# Patient Record
Sex: Male | Born: 1990 | Race: White | Hispanic: No | Marital: Single | Smoking: Never smoker
Health system: Southern US, Community
[De-identification: ages and names within clinical notes are randomized; demographics above are authoritative.]

## PROBLEM LIST (undated history)

## (undated) DIAGNOSIS — T7840XA Allergy, unspecified, initial encounter: Secondary | ICD-10-CM

## (undated) HISTORY — PX: EYE SURGERY: SHX253

## (undated) HISTORY — DX: Allergy, unspecified, initial encounter: T78.40XA

---

## 2012-06-25 ENCOUNTER — Ambulatory Visit (INDEPENDENT_AMBULATORY_CARE_PROVIDER_SITE_OTHER): Payer: BC Managed Care – PPO | Admitting: Psychiatry

## 2015-06-03 ENCOUNTER — Ambulatory Visit (INDEPENDENT_AMBULATORY_CARE_PROVIDER_SITE_OTHER): Payer: BC Managed Care – PPO | Admitting: Family Medicine

## 2015-06-03 ENCOUNTER — Encounter: Payer: Self-pay | Admitting: Family Medicine

## 2015-06-03 VITALS — BP 130/85 | HR 84 | Temp 97.2°F | Resp 12 | Ht 70.28 in | Wt 235.7 lb

## 2015-06-03 DIAGNOSIS — Z6833 Body mass index (BMI) 33.0-33.9, adult: Secondary | ICD-10-CM

## 2015-06-03 DIAGNOSIS — E669 Obesity, unspecified: Secondary | ICD-10-CM | POA: Insufficient documentation

## 2015-06-03 DIAGNOSIS — R030 Elevated blood-pressure reading, without diagnosis of hypertension: Secondary | ICD-10-CM | POA: Diagnosis not present

## 2015-06-03 DIAGNOSIS — Z6834 Body mass index (BMI) 34.0-34.9, adult: Secondary | ICD-10-CM | POA: Insufficient documentation

## 2015-06-03 LAB — TSH: TSH: 1.81 u[IU]/mL (ref 0.35–4.50)

## 2015-06-03 NOTE — Patient Instructions (Addendum)
A few things to remember from today's visit:   1. BMI 33.0-33.9,adult  - TSH - BASIC METABOLIC PANEL WITH GFR  2. Elevated blood pressure (not hypertension) Re-checked and within normal limits.  - TSH - BASIC METABOLIC PANEL WITH GFR    Monitor blood pressure periodically. Healthy diet and regular exercise help with prevention. Wt loss al helps.  Risk of elevated blood pressure increases with age. If labs normal I think you can follow annually, before if blood pressure > 140/90 and/or other problem presents.   If you sign-up for My chart, you can communicate easier with us in case you have any question or concern.
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
## 2015-06-04 LAB — BASIC METABOLIC PANEL WITH GFR
BUN: 16 mg/dL (ref 7–25)
CALCIUM: 9.6 mg/dL (ref 8.6–10.3)
CHLORIDE: 103 mmol/L (ref 98–110)
CO2: 25 mmol/L (ref 20–31)
Creat: 0.8 mg/dL (ref 0.60–1.35)
GLUCOSE: 88 mg/dL (ref 65–99)
POTASSIUM: 4.7 mmol/L (ref 3.5–5.3)
SODIUM: 140 mmol/L (ref 135–146)

## 2015-08-07 ENCOUNTER — Encounter: Payer: Self-pay | Admitting: Family Medicine

## 2015-08-07 ENCOUNTER — Ambulatory Visit (INDEPENDENT_AMBULATORY_CARE_PROVIDER_SITE_OTHER): Payer: BC Managed Care – PPO | Admitting: Family Medicine

## 2015-08-07 VITALS — BP 130/85 | HR 82 | Temp 98.2°F | Resp 12 | Ht 70.28 in | Wt 236.0 lb

## 2015-08-07 DIAGNOSIS — Z114 Encounter for screening for human immunodeficiency virus [HIV]: Secondary | ICD-10-CM | POA: Diagnosis not present

## 2015-08-07 DIAGNOSIS — Z711 Person with feared health complaint in whom no diagnosis is made: Secondary | ICD-10-CM | POA: Diagnosis not present

## 2015-08-07 DIAGNOSIS — R300 Dysuria: Secondary | ICD-10-CM | POA: Diagnosis not present

## 2015-08-07 LAB — POCT URINALYSIS DIPSTICK
BILIRUBIN UA: NEGATIVE
Glucose, UA: NEGATIVE
KETONES UA: NEGATIVE
LEUKOCYTES UA: NEGATIVE
Nitrite, UA: NEGATIVE
PH UA: 6
PROTEIN UA: NEGATIVE
RBC UA: NEGATIVE
Urobilinogen, UA: 0.2

## 2015-08-07 MED ORDER — DOXYCYCLINE HYCLATE 100 MG PO TABS: 100.0000 mg | ORAL_TABLET | Freq: Two times a day (BID) | ORAL | Status: AC

## 2015-08-07 NOTE — Progress Notes (Signed)
Pre visit review using our clinic review tool, if applicable. No additional management support is needed unless otherwise documented below in the visit note. 
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
## 2015-08-07 NOTE — Patient Instructions (Addendum)
A few things to remember from today's visit:    Screening for HIV (human immunodeficiency virus)  - HIV antibody (with reflex)  3. Concern about STD in male without diagnosis  - HIV antibody (with reflex) - doxycycline (VIBRA-TABS) 100 MG tablet; Take 1 tablet (100 mg total) by mouth 2 (two) times daily. With food  Dispense: 14 tablet; Refill: 0  4. Dysuria  - doxycycline (VIBRA-TABS) 100 MG tablet; Take 1 tablet (100 mg total) by mouth 2 (two) times daily. With food  Dispense: 14 tablet; Refill: 0    Today we will start antibiotics. Further recommendations would be given according to the lab results.  Condom use strongly recommended.  If you sign-up for My chart, you can communicate easier with us in case you have any question or concern.

## 2015-08-08 LAB — HIV ANTIBODY (ROUTINE TESTING W REFLEX): HIV: NONREACTIVE

## 2015-08-13 ENCOUNTER — Telehealth: Payer: Self-pay | Admitting: Family Medicine

## 2015-08-13 NOTE — Telephone Encounter (Signed)
Pt called back about results. Gave patient the results again and pt verbalized understanding. Pt said that he did not want to re test in 3 months.

## 2015-08-13 NOTE — Telephone Encounter (Signed)
Called and left a voicemail for patient. Calling in reference to his recent lab work. Chlamydia came back positive, but pt is already being treated with doxycycline. Pt can be re screened in about 3 months if he would like.

## 2016-06-24 ENCOUNTER — Ambulatory Visit (INDEPENDENT_AMBULATORY_CARE_PROVIDER_SITE_OTHER): Payer: BC Managed Care – PPO | Admitting: Family Medicine

## 2016-06-24 ENCOUNTER — Encounter: Payer: Self-pay | Admitting: Family Medicine

## 2016-06-24 VITALS — BP 130/72 | HR 84 | Temp 98.4°F | Resp 12 | Ht 70.28 in | Wt 210.5 lb

## 2016-06-24 DIAGNOSIS — R059 Cough, unspecified: Secondary | ICD-10-CM

## 2016-06-24 DIAGNOSIS — J029 Acute pharyngitis, unspecified: Secondary | ICD-10-CM | POA: Diagnosis not present

## 2016-06-24 DIAGNOSIS — R05 Cough: Secondary | ICD-10-CM | POA: Diagnosis not present

## 2016-06-24 LAB — POCT RAPID STREP A (OFFICE): Rapid Strep A Screen: NEGATIVE

## 2016-06-24 MED ORDER — HYDROCODONE-HOMATROPINE 5-1.5 MG/5ML PO SYRP: 5.0000 mL | ORAL_SOLUTION | Freq: Every evening | ORAL | 0 refills | Status: AC | PRN

## 2016-06-24 NOTE — Patient Instructions (Signed)
A few things to remember from today's visit:   Sore throat - Plan: POC Rapid Strep A  Acute pharyngitis, unspecified etiology - Plan: Culture, Group A Strep  viral infections are self-limited and we treat each symptom depending of severity.  Over the counter medications as decongestants and cold medications usually help, they need to be taken with caution if there is a history of high blood pressure or palpitations. Tylenol and/or Ibuprofen also helps with most symptoms (headache, muscle aching, fever,etc) Plenty of fluids. Honey helps with cough. Steam inhalations helps with runny nose, nasal congestion, and may prevent sinus infections. Cough and nasal congestion could last a few days and sometimes weeks. Please follow in not any better in 1-2 weeks or if symptoms get worse.    Symptomatic treatment: Over the counter Acetaminophen 500 mg and/or Ibuprofen (400-600 mg) if there is not contraindications; you can alternate in between both every 4-6 hours. Gargles with saline water and throat lozenges might also help. Cold fluids.    Seek prompt medical evaluation if you are having difficulty breathing, mouth swelling, throat closing up, not able to swallow liquids (drooling), skin rash/bruising, or worsening symptoms.  Please follow up in 2 weeks if not any better.    Please be sure medication list is accurate. If a new problem present, please set up appointment sooner than planned today.

## 2016-06-24 NOTE — Progress Notes (Signed)
HPI:  ACUTE VISIT  Chief Complaint  Patient presents with  . Sore Throat  . Cough    [name] is a 25 y.o.male here today complaining of 5-7 days of respiratory symptoms.  Productive cough mainly in the morning. Sore throat that seems better today. Fatigue has resolved.   Sore Throat   This is a new problem. The current episode started in the past 7 days. The problem has been gradually improving. Neither side of throat is experiencing more pain than the other. There has been no fever. The pain is moderate. Associated symptoms include congestion and coughing. Pertinent negatives include no abdominal pain, diarrhea, headaches, neck pain, shortness of breath, trouble swallowing or vomiting. He has had no exposure to strep or mono. The treatment provided mild relief.    + Nasal congestion, rhinorrhea, and post nasal drainage. Denies current fever,chills, or body aches.He thinks he might have some fever when symptoms first started. He has not noted chest pain, dyspnea, or wheezing. Bloody rhinorrhea until 2 days ago.  No Hx of recent travel. No sick contact.His 66 year old son goes to day care, he has had some runny nose but he thinks it is allergies. No known insect bite.  Hx of allergies: Yes  OTC medications for this problem: Nyquil,Dyquil  Symptoms otherwise better today.   Review of Systems  Constitutional: Negative for appetite change, chills, fatigue and fever.  HENT: Positive for congestion, nosebleeds, rhinorrhea and sore throat. Negative for mouth sores, postnasal drip, sneezing, trouble swallowing and voice change.   Eyes: Negative for redness and itching.  Respiratory: Positive for cough. Negative for shortness of breath and wheezing.   Cardiovascular: Negative.   Gastrointestinal: Negative for abdominal pain, diarrhea, nausea and vomiting.  Musculoskeletal: Negative for back pain, joint swelling and neck pain.  Skin: Negative for rash.    Allergic/Immunologic: Positive for environmental allergies.  Neurological: Negative for syncope, weakness and headaches.  Hematological: Negative for adenopathy. Does not bruise/bleed easily.    No current outpatient prescriptions on file prior to visit.   No current facility-administered medications on file prior to visit.     Past Medical History:  Diagnosis Date  . Allergy    Not on File  Social History   Social History  . Marital status: Married    Spouse name: N/A  . Number of children: N/A  . Years of education: N/A   Social History Main Topics  . Smoking status: Never Smoker  . Smokeless tobacco: Never Used  . Alcohol use No  . Drug use: No  . Sexual activity: Not Asked   Other Topics Concern  . None   Social History Narrative  . None    Vitals:   06/24/16 1052  BP: 130/72  Pulse: 84  Resp: 12  Temp: 98.4 F (36.9 C)  O2 sat at RA 98% Body mass index is 29.96 kg/m.   Physical Exam  Nursing note and vitals reviewed. Constitutional: He is oriented to person, place, and time. He appears well-developed. He does not appear ill. No distress.  HENT:  Head: Atraumatic.  Right Ear: Tympanic membrane, external ear and ear canal normal.  Left Ear: Tympanic membrane, external ear and ear canal normal.  Nose: Rhinorrhea present. Right sinus exhibits no maxillary sinus tenderness and no frontal sinus tenderness. Left sinus exhibits no maxillary sinus tenderness and no frontal sinus tenderness.  Mouth/Throat: Uvula is midline and mucous membranes are normal. Posterior oropharyngeal erythema present.  No oropharyngeal exudate or posterior oropharyngeal edema.  Post nasal drainage.  Eyes: Conjunctivae and EOM are normal.  Cardiovascular: Normal rate and regular rhythm.   No murmur heard. Respiratory: Effort normal and breath sounds normal. No stridor. No respiratory distress.  Lymphadenopathy:       Head (right side): No submandibular adenopathy present.        Head (left side): No submandibular adenopathy present.    He has cervical adenopathy.       Right cervical: Posterior cervical adenopathy present.       Left cervical: Posterior cervical adenopathy present.  Neurological: He is alert and oriented to person, place, and time. He has normal strength.  Skin: Skin is warm. No rash noted. No erythema.  Psychiatric: He has a normal mood and affect. His speech is normal.  Well groomed, good eye contact.      ASSESSMENT AND PLAN:   Ronald Ridgelaylor was seen today for sore throat and cough.  Diagnoses and all orders for this visit:  Sore throat -     POC Rapid Strep A  Acute pharyngitis, unspecified etiology -     Culture, Group A Strep  Cough -     HYDROcodone-homatropine (HYCODAN) 5-1.5 MG/5ML syrup; Take 5 mLs by mouth at bedtime as needed for cough.   Symptoms suggests a viral etiology, symptomatic treatment recommended in this case, so I do not think abx is needed at this time. Because he has a 26 yo at home, strep Cx was ordered. Instructed to monitor for signs of complications, including new onset of fever among some, clearly instructed about warning signs. I also explained that cough and nasal congestion can last a few days and sometimes weeks. Some side effects of Hydrocodone discussed, recommend taking at night for a few days.  F/U as needed.     -[name] was advised to follow if symptoms worsen or persist or new concerns arise.       Apolonia Ellwood G. [place], MD  [facility]. [place] office.

## 2016-06-26 LAB — CULTURE, GROUP A STREP

## 2018-02-14 ENCOUNTER — Emergency Department (HOSPITAL_COMMUNITY): Payer: Worker's Compensation

## 2018-02-14 ENCOUNTER — Emergency Department (HOSPITAL_COMMUNITY)
Admission: EM | Admit: 2018-02-14 | Discharge: 2018-02-14 | Disposition: A | Discharge status: Home / Self Care | Payer: Worker's Compensation | Attending: Emergency Medicine | Admitting: Emergency Medicine

## 2018-02-14 ENCOUNTER — Encounter (HOSPITAL_COMMUNITY): Payer: Self-pay | Admitting: Emergency Medicine

## 2018-02-14 ENCOUNTER — Other Ambulatory Visit: Payer: Self-pay

## 2018-02-14 DIAGNOSIS — S0990XA Unspecified injury of head, initial encounter: Secondary | ICD-10-CM

## 2018-02-14 DIAGNOSIS — Y990 Civilian activity done for income or pay: Secondary | ICD-10-CM | POA: Insufficient documentation

## 2018-02-14 DIAGNOSIS — Y9389 Activity, other specified: Secondary | ICD-10-CM | POA: Diagnosis not present

## 2018-02-14 DIAGNOSIS — Y92149 Unspecified place in prison as the place of occurrence of the external cause: Secondary | ICD-10-CM | POA: Insufficient documentation

## 2018-02-14 DIAGNOSIS — S0083XA Contusion of other part of head, initial encounter: Secondary | ICD-10-CM | POA: Diagnosis not present
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
## 2018-02-14 NOTE — Discharge Instructions (Addendum)
There was significant swelling and redness noted to the L side of your face today. You will need to put ice in the area to help with swelling, take Tylenol as needed for pain. There was no fracture noted on CT of your face. Read the following instructions regarding head injury precautions. Return to ED for worsening symptoms, severe headache, additional injuries, trouble moving your eyes or blurry vision.

## 2021-01-05 ENCOUNTER — Telehealth: Payer: Self-pay | Admitting: Nurse Practitioner

## 2021-01-05 NOTE — Telephone Encounter (Signed)
Patient called to see if he could schedule his physical. I let patient know that it had been about four years since he had been seen by Dr.Jordan which puts him over the threshold of being a new patient and our providers are currently not accepting new patients. Patient wanted message sent back to ask and declined the other offices numbers that may still be accepting new patients.     Good callback number is [phone]        Please advise

## 2021-01-18 NOTE — Telephone Encounter (Signed)
It is ok to re-establish care and to schedule CPE. Thanks, BJ

## 2021-01-20 NOTE — Telephone Encounter (Signed)
I tried to call patient but there was no answer. I left a message for him to call back to schedule

## 2021-02-16 NOTE — Progress Notes (Signed)
HPI: [name] is a 31 y.o.male here today for his routine physical examination.  Last CPE: 07/2017  Regular exercise 3 or more times per week: Wt lifting, obstacle training,boxing, and kick boxing. Following a healthy diet: He has not been consistent, trying to start high protein diet.  Chronic medical problems: Allergies and BMI 34.  Immunization History  Administered Date(s) Administered   PPD Test 06/20/2018   Tdap 01/24/2011   Health Maintenance  Topic Date Due   Hepatitis C Screening  Never done   COVID-19 Vaccine (1) 03/05/2021 (Originally 03/20/1991)   INFLUENZA VACCINE  05/07/2021 (Originally 09/07/2020)   TETANUS/TDAP  02/17/2022 (Originally 01/23/2021)   HIV Screening  Completed   Pneumococcal Vaccine 20-33 Years old  Aged Out   HPV VACCINES  Aged Out   -Negative for high alcohol intake or tobacco use. Sleeps about 8 hours.  -Concerns and/or follow up today:  His mother just had new hx of CAD, MI 2 months ago at age 32. Father died from MI at age 49.  He goes to chiropractor q 2 weeks and gets a massage monthly for prevention. He has some pain when he gets massage on lower back, according to pt, he was told it could be his kidneys.  Negative for gross hematuria, foam in urine,or decreased urine output. He would like renal function check.  Review of Systems  Constitutional:  Negative for activity change, appetite change, fatigue and fever.  HENT:  Negative for mouth sores, nosebleeds, sore throat, trouble swallowing and voice change.   Eyes:  Negative for redness and visual disturbance.  Respiratory:  Negative for cough, shortness of breath and wheezing.   Cardiovascular:  Negative for chest pain, palpitations and leg swelling.  Gastrointestinal:  Negative for abdominal pain, blood in stool, nausea and vomiting.  Endocrine: Negative for cold intolerance, heat intolerance, polydipsia, polyphagia and polyuria.  Genitourinary:  Negative for  decreased urine volume, dysuria, genital sores and testicular pain.  Musculoskeletal:  Negative for gait problem and myalgias.  Skin:  Negative for color change and rash.  Allergic/Immunologic: Positive for environmental allergies.  Neurological:  Negative for syncope, weakness and headaches.  Hematological:  Negative for adenopathy. Does not bruise/bleed easily.  Psychiatric/Behavioral:  Negative for confusion and sleep disturbance.   All other systems reviewed and are negative.  No current outpatient medications on file prior to visit.   No current facility-administered medications on file prior to visit.   Past Medical History:  Diagnosis Date   Allergy    History reviewed. No pertinent surgical history.  No Known Allergies  Family History  Problem Relation Age of Onset   Heart disease Mother 21       CAD   Hypertension Father    Heart failure Father    Hypertension Maternal Grandfather    Social History   Socioeconomic History   Marital status: Single    Spouse name: Not on file   Number of children: Not on file   Years of education: Not on file   Highest education level: Not on file  Occupational History   Not on file  Tobacco Use   Smoking status: Never   Smokeless tobacco: Never  Substance and Sexual Activity   Alcohol use: No    Alcohol/week: 0.0 standard drinks   Drug use: No   Sexual activity: Not on file  Other Topics Concern   Not on file  Social History Narrative   Not on file   Social Determinants  of Health   Financial Resource Strain: Not on file  Food Insecurity: Not on file  Transportation Needs: Not on file  Physical Activity: Not on file  Stress: Not on file  Social Connections: Not on file   Vitals:   02/17/21 0743  BP: 130/80  Pulse: 95  Resp: 16  SpO2: 99%   Body mass index is 34.2 kg/m.  Wt Readings from Last 3 Encounters:  02/17/21 238 lb 6 oz (108.1 kg)  02/14/18 210 lb (95.3 kg)  06/24/16 210 lb 8 oz (95.5 kg)    Physical Exam Vitals and nursing note reviewed.  Constitutional:      General: He is not in acute distress.    Appearance: He is well-developed.  HENT:     Head: Normocephalic and atraumatic.     Right Ear: External ear normal. Tympanic membrane is not erythematous.     Left Ear: Tympanic membrane, ear canal and external ear normal.     Ears:     Comments: Cerumen excess in right ear canal, could not see TM.    Mouth/Throat:     Mouth: Mucous membranes are moist.     Pharynx: Oropharynx is clear. Uvula midline.     Tonsils: 1+ on the right. 1+ on the left.  Eyes:     Extraocular Movements: Extraocular movements intact.     Conjunctiva/sclera: Conjunctivae normal.     Pupils: Pupils are equal, round, and reactive to light.  Neck:     Thyroid: No thyromegaly.     Trachea: No tracheal deviation.  Cardiovascular:     Rate and Rhythm: Normal rate and regular rhythm.     Pulses:          Dorsalis pedis pulses are 2+ on the right side and 2+ on the left side.     Heart sounds: No murmur heard. Pulmonary:     Effort: Pulmonary effort is normal. No respiratory distress.     Breath sounds: Normal breath sounds.  Abdominal:     Palpations: Abdomen is soft. There is no hepatomegaly or mass.     Tenderness: There is no abdominal tenderness.  Genitourinary:    Comments: No concerns. Musculoskeletal:        General: No tenderness.     Cervical back: Normal range of motion.     Comments: No major deformities appreciated and no signs of synovitis.  Lymphadenopathy:     Cervical: No cervical adenopathy.     Upper Body:     Right upper body: No supraclavicular adenopathy.     Left upper body: No supraclavicular adenopathy.  Skin:    General: Skin is warm.     Findings: No erythema.  Neurological:     General: No focal deficit present.     Mental Status: He is alert and oriented to person, place, and time.     Cranial Nerves: No cranial nerve deficit.     Sensory: No sensory  deficit.     Gait: Gait normal.     Deep Tendon Reflexes:     Reflex Scores:      Bicep reflexes are 2+ on the right side and 2+ on the left side.      Patellar reflexes are 2+ on the right side and 2+ on the left side. Psychiatric:        Mood and Affect: Mood and affect normal.   ASSESSMENT AND PLAN:  [name] was seen today for annual exam.  Diagnoses and all orders  for this visit: Orders Placed This Encounter  Procedures   Hepatitis C antibody   Basic metabolic panel   Lipid panel   HIV Antibody (routine testing w rflx)   RPR   Lab Results  Component Value Date   CHOL 161 02/17/2021   HDL 51.10 02/17/2021   LDLCALC 79 02/17/2021   TRIG 154.0 (H) 02/17/2021   CHOLHDL 3 02/17/2021   Lab Results  Component Value Date   CREATININE 1.11 02/17/2021   BUN 20 02/17/2021   NA 137 02/17/2021   K 4.2 02/17/2021   CL 100 02/17/2021   CO2 30 02/17/2021   Routine general medical examination at a health care facility We discussed the importance of regular physical activity and healthy diet for prevention of chronic illness and/or complications. Preventive guidelines reviewed. Vaccination up to date. Next CPE in a year.  Encounter for HCV screening test for low risk patient -     Hepatitis C antibody  Screening for STD (sexually transmitted disease) -     RPR -     HIV Antibody (routine testing w rflx) -     Urine cytology ancillary only  Screening for lipoid disorders -     Lipid panel  Screening for endocrine, metabolic and immunity disorder -     Basic metabolic panel  Return in 1 year (on 02/17/2022) for cpe.  Mackenzie Lia G. Martinique, MD  [facility]. [place] office.

## 2021-02-17 ENCOUNTER — Other Ambulatory Visit (HOSPITAL_COMMUNITY)
Admission: RE | Admit: 2021-02-17 | Discharge: 2021-02-17 | Disposition: A | Discharge status: Home / Self Care | Payer: 59 | Source: Ambulatory Visit | Attending: Family Medicine | Admitting: Family Medicine

## 2021-02-17 ENCOUNTER — Encounter: Payer: Self-pay | Admitting: Family Medicine

## 2021-02-17 ENCOUNTER — Ambulatory Visit (INDEPENDENT_AMBULATORY_CARE_PROVIDER_SITE_OTHER): Payer: 59 | Admitting: Family Medicine

## 2021-02-17 VITALS — BP 130/80 | HR 95 | Resp 16 | Ht 70.0 in | Wt 238.4 lb

## 2021-02-17 DIAGNOSIS — Z13228 Encounter for screening for other metabolic disorders: Secondary | ICD-10-CM

## 2021-02-17 DIAGNOSIS — Z13220 Encounter for screening for lipoid disorders: Secondary | ICD-10-CM

## 2021-02-17 DIAGNOSIS — Z1329 Encounter for screening for other suspected endocrine disorder: Secondary | ICD-10-CM | POA: Diagnosis not present

## 2021-02-17 DIAGNOSIS — Z113 Encounter for screening for infections with a predominantly sexual mode of transmission: Secondary | ICD-10-CM | POA: Diagnosis not present

## 2021-02-17 DIAGNOSIS — Z1159 Encounter for screening for other viral diseases: Secondary | ICD-10-CM

## 2021-02-17 DIAGNOSIS — Z0000 Encounter for general adult medical examination without abnormal findings: Secondary | ICD-10-CM

## 2021-02-17 DIAGNOSIS — Z130 Encounter for screening for diseases of the blood and blood-forming organs and certain disorders involving the immune mechanism: Secondary | ICD-10-CM | POA: Diagnosis not present

## 2021-02-17 LAB — LIPID PANEL
Cholesterol: 161 mg/dL (ref 0–200)
HDL: 51.1 mg/dL (ref >39.00)
LDL Cholesterol: 79 mg/dL (ref 0–99)
NonHDL: 109.42
Total CHOL/HDL Ratio: 3
Triglycerides: 154 mg/dL — ABNORMAL HIGH (ref 0.0–149.0)
VLDL: 30.8 mg/dL (ref 0.0–40.0)

## 2021-02-17 LAB — BASIC METABOLIC PANEL
BUN: 20 mg/dL (ref 6–23)
CO2: 30 mEq/L (ref 19–32)
Calcium: 9.7 mg/dL (ref 8.4–10.5)
Chloride: 100 mEq/L (ref 96–112)
Creatinine, Ser: 1.11 mg/dL (ref 0.40–1.50)
GFR: 89.16 mL/min (ref >60.00)
Glucose, Bld: 89 mg/dL (ref 70–99)
Potassium: 4.2 mEq/L (ref 3.5–5.1)
Sodium: 137 mEq/L (ref 135–145)

## 2021-02-17 NOTE — Patient Instructions (Addendum)
A few things to remember from today's visit:  Routine general medical examination at a health care facility  Encounter for HCV screening test for low risk patient - Plan: Hepatitis C antibody  Screening for STD (sexually transmitted disease) - Plan: Urine cytology ancillary only, HIV Antibody (routine testing w rflx), RPR  Screening for lipoid disorders - Plan: Lipid panel  Screening for endocrine, metabolic and immunity disorder - Plan: Basic metabolic panel  Do not use My Chart to request refills or for acute issues that need immediate attention.   Please be sure medication list is accurate. If a new problem present, please set up appointment sooner than planned today.  Health Maintenance, Male Adopting a healthy lifestyle and getting preventive care are important in promoting health and wellness. Ask your health care provider about: The right schedule for you to have regular tests and exams. Things you can do on your own to prevent diseases and keep yourself healthy. What should I know about diet, weight, and exercise? Eat a healthy diet  Eat a diet that includes plenty of vegetables, fruits, low-fat dairy products, and lean protein. Do not eat a lot of foods that are high in solid fats, added sugars, or sodium. Maintain a healthy weight Body mass index (BMI) is a measurement that can be used to identify possible weight problems. It estimates body fat based on height and weight. Your health care provider can help determine your BMI and help you achieve or maintain a healthy weight. Get regular exercise Get regular exercise. This is one of the most important things you can do for your health. Most adults should: Exercise for at least 150 minutes each week. The exercise should increase your heart rate and make you sweat (moderate-intensity exercise). Do strengthening exercises at least twice a week. This is in addition to the moderate-intensity exercise. Spend less time sitting. Even  light physical activity can be beneficial. Watch cholesterol and blood lipids Have your blood tested for lipids and cholesterol at 31 years of age, then have this test every 5 years. You may need to have your cholesterol levels checked more often if: Your lipid or cholesterol levels are high. You are older than 31 years of age. You are at high risk for heart disease. What should I know about cancer screening? Many types of cancers can be detected early and may often be prevented. Depending on your health history and family history, you may need to have cancer screening at various ages. This may include screening for: Colorectal cancer. Prostate cancer. Skin cancer. Lung cancer. What should I know about heart disease, diabetes, and high blood pressure? Blood pressure and heart disease High blood pressure causes heart disease and increases the risk of stroke. This is more likely to develop in people who have high blood pressure readings or are overweight. Talk with your health care provider about your target blood pressure readings. Have your blood pressure checked: Every 3-5 years if you are 56-42 years of age. Every year if you are 51 years old or older. If you are between the ages of 1 and 72 and are a current or former smoker, ask your health care provider if you should have a one-time screening for abdominal aortic aneurysm (AAA). Diabetes Have regular diabetes screenings. This checks your fasting blood sugar level. Have the screening done: Once every three years after age 42 if you are at a normal weight and have a low risk for diabetes. More often and at a younger  age if you are overweight or have a high risk for diabetes. What should I know about preventing infection? Hepatitis B If you have a higher risk for hepatitis B, you should be screened for this virus. Talk with your health care provider to find out if you are at risk for hepatitis B infection. Hepatitis C Blood testing is  recommended for: Everyone born from 1 through 1965. Anyone with known risk factors for hepatitis C. Sexually transmitted infections (STIs) You should be screened each year for STIs, including gonorrhea and chlamydia, if: You are sexually active and are younger than 31 years of age. You are older than 31 years of age and your health care provider tells you that you are at risk for this type of infection. Your sexual activity has changed since you were last screened, and you are at increased risk for chlamydia or gonorrhea. Ask your health care provider if you are at risk. Ask your health care provider about whether you are at high risk for HIV. Your health care provider may recommend a prescription medicine to help prevent HIV infection. If you choose to take medicine to prevent HIV, you should first get tested for HIV. You should then be tested every 3 months for as long as you are taking the medicine. Follow these instructions at home: Alcohol use Do not drink alcohol if your health care provider tells you not to drink. If you drink alcohol: Limit how much you have to 0-2 drinks a day. Know how much alcohol is in your drink. In the [place] one drink equals one 12 oz bottle of beer (355 mL), one 5 oz glass of wine (148 mL), or one 1 oz glass of hard liquor (44 mL). Lifestyle Do not use any products that contain nicotine or tobacco. These products include cigarettes, chewing tobacco, and vaping devices, such as e-cigarettes. If you need help quitting, ask your health care provider. Do not use street drugs. Do not share needles. Ask your health care provider for help if you need support or information about quitting drugs. General instructions Schedule regular health, dental, and eye exams. Stay current with your vaccines. Tell your health care provider if: You often feel depressed. You have ever been abused or do not feel safe at home. Summary Adopting a healthy lifestyle and getting  preventive care are important in promoting health and wellness. Follow your health care provider's instructions about healthy diet, exercising, and getting tested or screened for diseases. Follow your health care provider's instructions on monitoring your cholesterol and blood pressure. This information is not intended to replace advice given to you by your health care provider. Make sure you discuss any questions you have with your health care provider. Document Revised: 06/15/2020 Document Reviewed: 06/15/2020 Elsevier Patient Education  2022 [employer].

## 2021-02-18 LAB — URINE CYTOLOGY ANCILLARY ONLY
Chlamydia: NEGATIVE
Comment: NEGATIVE
Comment: NEGATIVE
Comment: NORMAL
Neisseria Gonorrhea: NEGATIVE
Trichomonas: NEGATIVE

## 2021-02-18 LAB — HIV ANTIBODY (ROUTINE TESTING W REFLEX): HIV 1&2 Ab, 4th Generation: NONREACTIVE

## 2021-02-18 LAB — HEPATITIS C ANTIBODY
Hepatitis C Ab: NONREACTIVE
SIGNAL TO CUT-OFF: 0.15 (ref <1.00)

## 2021-02-18 LAB — RPR: RPR Ser Ql: NONREACTIVE

## 2021-06-22 ENCOUNTER — Telehealth: Payer: Self-pay | Admitting: Family Medicine

## 2021-06-22 NOTE — Telephone Encounter (Signed)
Patient calling in with respiratory symptoms: ?Shortness of breath, chest pain, palpitations or other red words send to Triage ? ?Does the patient have a fever over 100, cough, congestion, sore throat, runny nose, lost of taste/smell (please list symptoms that patient has)?cough,st,ha ? ?What date did symptoms start?4 days ago ?(If over 5 days ago, pt may be scheduled for in person visit) ? ?Have you tested for Covid in the last 5 days? No  ? ?If yes, was it positive []  OR negative [] ? If positive in the last 5 days, please schedule virtual visit now. If negative, schedule for an in person OV with the next available provider if PCP has no openings. Please also let patient know they will be tested again (follow the script below) ? ?"you will have to arrive 32mins prior to your appt time to be Covid tested. Please park in back of office at the [facility] & call [phone] to let the staff know you have arrived. A staff member will meet you at your car to do a rapid covid test. Once the test has resulted you will be notified by phone of your results to determine if appt will remain an in person visit or be converted to a virtual/phone visit. If you arrive less than 28mins before your appt time, your visit will be automatically converted to virtual & any recommended testing will happen AFTER the visit." ?Pt has an appt with dr Martinique on 06-23-2021 2 pm ? ?THINGS TO REMEMBER ? ?If no availability for virtual visit in office,  please schedule another [place] office ? ?If no availability at another [place] office, please instruct patient that they can schedule an evisit or virtual visit through their mychart account. Visits up to 8pm ? ?patients can be seen in office 5 days after positive COVID test ? ?  ?

## 2021-06-22 NOTE — Progress Notes (Addendum)
ACUTE VISIT Chief Complaint  Patient presents with   Cough    Started last Thursday; productive; hard to sleep at night with cough. Did have vomiting & diarrhea over the weekend, but those have gotten a little better. Still having some diarrhea. Having some soreness from coughing & all the congestion.    Sore Throat   HPI: Ronald Foster is a 31 y.o. male, who is here today complaining of respiratory symptoms as described above.  Cough This is a new problem. The current episode started in the past 7 days. The problem has been unchanged. The problem occurs every few hours. The cough is Productive of sputum. Associated symptoms include a fever, headaches, myalgias, nasal congestion, postnasal drip, rhinorrhea, a sore throat and wheezing. Pertinent negatives include no chest pain, chills, ear congestion, ear pain, heartburn, rash, shortness of breath, sweats or weight loss. The symptoms are aggravated by lying down. He has tried OTC cough suppressant for the symptoms. The treatment provided no relief. His past medical history is significant for environmental allergies.  Headache 5 days ago, frontal pressure, has resolved. He has seen pinkish sputum a couple times, attributed to throat irritation when trying to bring sputum up.  Sometimes he feels SOB, alleviated by using Flonase nasal spray. Chest tightness and "little" wheezing, mainly when lying down in bed. Cough is interfering with sleep. He had diarrhea, nausea, vomiting for 2 days. He has taken Tylenol, ibuprofen, and Nyquil.  No sick contacts or recent travel. Negative for insect bites or camping within the past few weeks.  Review of Systems  Constitutional:  Positive for activity change, appetite change, fatigue and fever. Negative for chills and weight loss.  HENT:  Positive for postnasal drip, rhinorrhea and sore throat. Negative for ear pain, facial swelling, mouth sores and sinus pain.   Respiratory:  Positive  for cough and wheezing. Negative for shortness of breath.   Cardiovascular:  Negative for chest pain, palpitations and leg swelling.  Gastrointestinal:  Negative for abdominal pain and heartburn.  Genitourinary:  Negative for decreased urine volume, dysuria and hematuria.  Musculoskeletal:  Positive for back pain and myalgias.  Skin:  Negative for rash.  Allergic/Immunologic: Positive for environmental allergies.  Neurological:  Positive for headaches.  Rest see pertinent positives and negatives per HPI.  No current outpatient medications on file prior to visit.   No current facility-administered medications on file prior to visit.   Past Medical History:  Diagnosis Date   Allergy    No Known Allergies  Social History   Socioeconomic History   Marital status: Single    Spouse name: Not on file   Number of children: Not on file   Years of education: Not on file   Highest education level: Not on file  Occupational History   Not on file  Tobacco Use   Smoking status: Never   Smokeless tobacco: Never  Substance and Sexual Activity   Alcohol use: No    Alcohol/week: 0.0 standard drinks   Drug use: No   Sexual activity: Not on file  Other Topics Concern   Not on file  Social History Narrative   Not on file   Social Determinants of Health   Financial Resource Strain: Not on file  Food Insecurity: Not on file  Transportation Needs: Not on file  Physical Activity: Not on file  Stress: Not on file  Social Connections: Not on file   Vitals:   06/23/21 1351  BP: 124/80  Pulse: 89  Resp: 16  Temp: 97.9 F (36.6 C)  SpO2: 98%   Body mass index is 33.16 kg/m.  Physical Exam Vitals and nursing note reviewed.  Constitutional:      General: He is not in acute distress.    Appearance: He is well-developed. He is not ill-appearing.  HENT:     Head: Normocephalic and atraumatic.     Right Ear: External ear normal. Tympanic membrane is not erythematous.     Left Ear:  External ear normal. Tympanic membrane is not erythematous.     Ears:     Comments: Cerumen excess, TM's seen partially.    Nose: Rhinorrhea present.     Right Sinus: No maxillary sinus tenderness or frontal sinus tenderness.     Left Sinus: No maxillary sinus tenderness or frontal sinus tenderness.     Mouth/Throat:     Mouth: Mucous membranes are moist.     Pharynx: Uvula midline. Posterior oropharyngeal erythema (mild) present. No pharyngeal swelling, oropharyngeal exudate or uvula swelling.  Eyes:     Conjunctiva/sclera: Conjunctivae normal.  Cardiovascular:     Rate and Rhythm: Normal rate and regular rhythm.     Heart sounds: No murmur heard. Pulmonary:     Effort: Pulmonary effort is normal. No respiratory distress.     Breath sounds: Normal breath sounds. No stridor.  Lymphadenopathy:     Head:     Right side of head: No submandibular adenopathy.     Left side of head: No submandibular adenopathy.     Cervical: Cervical adenopathy (< 1 cm) present.     Right cervical: Superficial cervical adenopathy present.     Left cervical: Superficial cervical adenopathy present.  Skin:    General: Skin is warm.     Findings: No erythema or rash.  Neurological:     Mental Status: He is alert and oriented to person, place, and time.  Psychiatric:     Comments: Well groomed, good eye contact.   ASSESSMENT AND PLAN:  Ronald Foster was seen today for cough and sore throat.  Diagnoses and all orders for this visit: Orders Placed This Encounter  Procedures   DG Chest 2 View   POC COVID-19 BinaxNow   POCT rapid strep A   URI, acute Symptoms suggests a viral etiology, I explained patient that symptomatic treatment is usually recommended in this case, so I do not think abx is needed at this time. COVID-19 test done today negative. Instructed to monitor for signs of complications, including new onset of fever among some, clearly instructed about warning signs. F/U as needed.  -      fluticasone (FLONASE) 50 MCG/ACT nasal spray; Place 1 spray into both nostrils 2 (two) times daily.  Acute cough Explained that cough and congestion can last a few more days and even weeks after acute symptoms have resolved. Cough is interfering with sleep, so recommend Hycodan 5 mL at bedtime as needed for cough. We discussed some side effects. Further recommendation will be given according to chest x-ray results.  -     HYDROcodone bit-homatropine (HYCODAN) 5-1.5 MG/5ML syrup; Take 5 mLs by mouth every 12 (twelve) hours as needed for up to 10 days for cough.  Sore throat He has improved. Rapid strep negative.  Reactive airway disease without asthma Today lung auscultation negative. Recommend 3 days of prednisone, instructed to take medication with breakfast.  We discussed some side effects. Albuterol inh 2 puff every 6 hours for a week then as  needed for wheezing or shortness of breath.  Instructed about warning signs.  -     predniSONE (DELTASONE) 20 MG tablet; Take 2 tablets (40 mg total) by mouth daily with breakfast for 3 days. -     albuterol (VENTOLIN HFA) 108 (90 Base) MCG/ACT inhaler; Inhale 2 puffs into the lungs every 6 (six) hours as needed for wheezing or shortness of breath.  I spent a total of 34 minutes in both face to face and non face to face activities for this visit on the date of this encounter. During this time history was obtained and documented, examination was performed, and assessment/plan discussed.  Return if symptoms worsen or fail to improve.  Silver Parkey G. [place], MD  [facility]. [place] office.

## 2021-06-23 ENCOUNTER — Ambulatory Visit (INDEPENDENT_AMBULATORY_CARE_PROVIDER_SITE_OTHER): Payer: 59

## 2021-06-23 ENCOUNTER — Encounter: Payer: Self-pay | Admitting: Family Medicine

## 2021-06-23 ENCOUNTER — Ambulatory Visit: Payer: 59 | Admitting: Family Medicine

## 2021-06-23 VITALS — BP 124/80 | HR 89 | Temp 97.9°F | Resp 16 | Ht 70.0 in | Wt 231.1 lb

## 2021-06-23 DIAGNOSIS — R051 Acute cough: Secondary | ICD-10-CM

## 2021-06-23 DIAGNOSIS — J989 Respiratory disorder, unspecified: Secondary | ICD-10-CM

## 2021-06-23 DIAGNOSIS — J069 Acute upper respiratory infection, unspecified: Secondary | ICD-10-CM

## 2021-06-23 DIAGNOSIS — J029 Acute pharyngitis, unspecified: Secondary | ICD-10-CM

## 2021-06-23 LAB — POCT RAPID STREP A (OFFICE): Rapid Strep A Screen: NEGATIVE

## 2021-06-23 LAB — POC COVID19 BINAXNOW: SARS Coronavirus 2 Ag: NEGATIVE

## 2021-06-23 MED ORDER — HYDROCODONE BIT-HOMATROP MBR 5-1.5 MG/5ML PO SOLN: 5.0000 mL | Freq: Two times a day (BID) | ORAL | 0 refills | Status: AC | PRN

## 2021-06-23 MED ORDER — ALBUTEROL SULFATE HFA 108 (90 BASE) MCG/ACT IN AERS: 2.0000 | INHALATION_SPRAY | Freq: Four times a day (QID) | RESPIRATORY_TRACT | 0 refills | Status: DC | PRN

## 2021-06-23 MED ORDER — PREDNISONE 20 MG PO TABS: 40.0000 mg | ORAL_TABLET | Freq: Every day | ORAL | 0 refills | Status: AC

## 2021-06-23 MED ORDER — FLUTICASONE PROPIONATE 50 MCG/ACT NA SUSP: 1.0000 | Freq: Two times a day (BID) | NASAL | 0 refills | Status: DC

## 2021-06-23 NOTE — Patient Instructions (Signed)
A few things to remember from today's visit: ? ? ?Acute cough - Plan: POC COVID-19 BinaxNow, DG Chest 2 View, HYDROcodone bit-homatropine (HYCODAN) 5-1.5 MG/5ML syrup ? ?Sore throat - Plan: POC COVID-19 BinaxNow, POCT rapid strep A ? ?Reactive airway disease without asthma - Plan: DG Chest 2 View, predniSONE (DELTASONE) 20 MG tablet, albuterol (VENTOLIN HFA) 108 (90 Base) MCG/ACT inhaler ? ?URI, acute - Plan: fluticasone (FLONASE) 50 MCG/ACT nasal spray ? ?If you need refills please call your pharmacy. ?Do not use My Chart to request refills or for acute issues that need immediate attention. ?  ?viral infections are self-limited and we treat each symptom depending of severity.  ?Over the counter medications as decongestants and cold medications usually help, they need to be taken with caution if there is a history of high blood pressure or palpitations. ?Tylenol and/or Ibuprofen also helps with most symptoms (headache, muscle aching, fever,etc) ?Plenty of fluids. ?Honey helps with cough. ?Steam inhalations helps with runny nose, nasal congestion, and may prevent sinus infections. ?Cough and nasal congestion could last a few days and sometimes weeks. ?Please follow in not any better in 1-2 weeks or if symptoms get worse. ? ?Take Prednisone with breakfast. ?Albuterol inh 2 puff every 6 hours for a week then as needed for wheezing or shortness of breath.  ?Cough medication at bedtime. ? ?Please be sure medication list is accurate. ?If a new problem present, please set up appointment sooner than planned today. ? ? ? ? ? ? ? ?

## 2021-08-17 NOTE — Progress Notes (Unsigned)
ACUTE VISIT Chief Complaint  Patient presents with   Cough    Ongoing x 3-4 weeks, worse the longer he talks.    HPI: Ronald Foster is a 31 y.o. male, who is here today complaining of persistent cough as described above. He is asking for abx to clean his lungs. Most of the time non productive but in the morning he may have some sputum. "Tickle" in his throat. He was seen on 06/23/21 for cough, at that time he had wheezing, Albuterol inh helped.  Cough This is a recurrent problem. The current episode started 1 to 4 weeks ago. The problem has been unchanged. The problem occurs every few hours. The cough is Non-productive. Associated symptoms include nasal congestion, postnasal drip and rhinorrhea. Pertinent negatives include no chest pain, chills, ear congestion, ear pain, fever, headaches, heartburn, hemoptysis, myalgias, rash, sore throat, shortness of breath, sweats, weight loss or wheezing. The symptoms are aggravated by exercise and lying down. He has tried OTC cough suppressant for the symptoms. The treatment provided mild relief. His past medical history is significant for environmental allergies. There is no history of asthma or COPD.  A week ago took Mucinex.  Negative for voice changes. No hx of tobacco use, sick contact,or recent travel. Allergic rhinitis on Flonase nasal spray.  Review of Systems  Constitutional:  Negative for chills, fever, unexpected weight change and weight loss.  HENT:  Positive for postnasal drip and rhinorrhea. Negative for ear pain and sore throat.   Respiratory:  Positive for cough. Negative for hemoptysis, shortness of breath and wheezing.   Cardiovascular:  Negative for chest pain.  Gastrointestinal:  Negative for abdominal pain, heartburn, nausea and vomiting.  Genitourinary:  Negative for decreased urine volume and hematuria.  Musculoskeletal:  Negative for myalgias.  Skin:  Negative for rash.  Allergic/Immunologic: Positive for  environmental allergies.  Neurological:  Negative for syncope, weakness and headaches.  Hematological:  Negative for adenopathy. Does not bruise/bleed easily.  Rest see pertinent positives and negatives per HPI.  Current Outpatient Medications on File Prior to Visit  Medication Sig Dispense Refill   albuterol (VENTOLIN HFA) 108 (90 Base) MCG/ACT inhaler Inhale 2 puffs into the lungs every 6 (six) hours as needed for wheezing or shortness of breath. 8 g 0   fluticasone (FLONASE) 50 MCG/ACT nasal spray Place 1 spray into both nostrils 2 (two) times daily. 16 g 0   No current facility-administered medications on file prior to visit.   Past Medical History:  Diagnosis Date   Allergy    No Known Allergies  Social History   Socioeconomic History   Marital status: Single    Spouse name: Not on file   Number of children: Not on file   Years of education: Not on file   Highest education level: Not on file  Occupational History   Not on file  Tobacco Use   Smoking status: Never   Smokeless tobacco: Never  Substance and Sexual Activity   Alcohol use: No    Alcohol/week: 0.0 standard drinks of alcohol   Drug use: No   Sexual activity: Not on file  Other Topics Concern   Not on file  Social History Narrative   Not on file   Social Determinants of Health   Financial Resource Strain: Not on file  Food Insecurity: Not on file  Transportation Needs: Not on file  Physical Activity: Not on file  Stress: Not on file  Social Connections: Not on file  Vitals:   08/18/21 0945  BP: 126/80  Pulse: 100  Resp: 12  Temp: 98.4 F (36.9 C)  SpO2: 98%   Wt Readings from Last 3 Encounters:  08/18/21 239 lb 4 oz (108.5 kg)  06/23/21 231 lb 2 oz (104.8 kg)  02/17/21 238 lb 6 oz (108.1 kg)  Body mass index is 34.33 kg/m.  Physical Exam Vitals and nursing note reviewed.  Constitutional:      General: He is not in acute distress.    Appearance: He is well-developed. He is not  ill-appearing.  HENT:     Head: Normocephalic and atraumatic.     Right Ear: Tympanic membrane, ear canal and external ear normal.     Left Ear: Tympanic membrane, ear canal and external ear normal.     Nose: No rhinorrhea.     Right Turbinates: Enlarged.     Left Turbinates: Enlarged.     Mouth/Throat:     Mouth: Mucous membranes are moist.     Pharynx: Oropharynx is clear.  Eyes:     Conjunctiva/sclera: Conjunctivae normal.  Cardiovascular:     Rate and Rhythm: Normal rate and regular rhythm.     Heart sounds: No murmur heard. Pulmonary:     Effort: Pulmonary effort is normal. No respiratory distress.     Breath sounds: Normal breath sounds. No stridor.  Lymphadenopathy:     Head:     Right side of head: No submandibular adenopathy.     Left side of head: No submandibular adenopathy.     Cervical: No cervical adenopathy.  Skin:    General: Skin is warm.     Findings: No erythema or rash.  Neurological:     Mental Status: He is alert and oriented to person, place, and time.  Psychiatric:     Comments: Well groomed, good eye contact.   ASSESSMENT AND PLAN:  [name] was seen today for cough.  Diagnoses and all orders for this visit:  Mild intermittent reactive airway disease without complication Trial of Advair 250-50 mcg bid started today. Side effects discussed,instructed to swish after use. Albuterol inh 2 puff qid prn. Instructed about warning signs. F/U in 6 weeks, before if needed.  -     fluticasone-salmeterol (ADVAIR DISKUS) 250-50 MCG/ACT AEPB; Inhale 1 puff into the lungs in the morning and at bedtime.  Cough, persistent We discussed possible etiologies. Hx and examination today do not suggest an infectious process, so I do not think abx is needed at this time. CXR on 06/23/21 was negative for acute process, I do not think imaging is needed at this time. LABA/ICS added today. If it does not improved/resolved, will consider pulmonology referral.  Return  in about 6 weeks (around 09/29/2021).  Ronald Humm G. [place], MD  [facility]. [place] office.

## 2021-08-18 ENCOUNTER — Ambulatory Visit: Payer: 59 | Admitting: Family Medicine

## 2021-08-18 ENCOUNTER — Encounter: Payer: Self-pay | Admitting: Family Medicine

## 2021-08-18 VITALS — BP 126/80 | HR 100 | Temp 98.4°F | Resp 12 | Ht 70.0 in | Wt 239.2 lb

## 2021-08-18 DIAGNOSIS — R053 Chronic cough: Secondary | ICD-10-CM

## 2021-08-18 DIAGNOSIS — J4520 Mild intermittent asthma, uncomplicated: Secondary | ICD-10-CM | POA: Diagnosis not present

## 2021-08-18 MED ORDER — FLUTICASONE-SALMETEROL 250-50 MCG/ACT IN AEPB: 1.0000 | INHALATION_SPRAY | Freq: Two times a day (BID) | RESPIRATORY_TRACT | 1 refills | Status: DC

## 2021-08-18 NOTE — Patient Instructions (Addendum)
A few things to remember from today's visit:  Mild intermittent reactive airway disease without complication - Plan: fluticasone-salmeterol (ADVAIR DISKUS) 250-50 MCG/ACT AEPB  Cough, persistent  If you need refills please call your pharmacy. Do not use My Chart to request refills or for acute issues that need immediate attention.   I do not think you need antibiotic, it does not seem to be an infectious process. ? Asthma. Advair started today to use morning and night. Swish after use. Continue Albuterol inh as needed.  Please be sure medication list is accurate. If a new problem present, please set up appointment sooner than planned today.  Asthma, Adult  Asthma is a condition that causes swelling and narrowing of the airways. These are the passages that lead from the nose and mouth down into the lungs. When asthma symptoms get worse it is called an asthma attack or flare. This can make it hard to breathe. Asthma flares can range from minor to life-threatening. There is no cure for asthma, but medicines and lifestyle changes can help to control it. What are the causes? It is not known exactly what causes asthma, but certain things can cause asthma symptoms to get worse (triggers). What can trigger an asthma attack? Cigarette smoke. Mold. Dust. Your pet's skin flakes (dander). Cockroaches. Pollen. Air pollution (like household cleaners, wood smoke, smog, or Therapist, occupational). What are the signs or symptoms? Trouble breathing (shortness of breath). Coughing. Making high-pitched whistling sounds when you breathe, most often when you breathe out (wheezing). Chest tightness. Tiredness with little activity. Poor exercise tolerance. How is this treated? Controller medicines that help prevent asthma symptoms. Fast-acting reliever or rescue medicines. These give short-term relief of asthma symptoms. Allergy medicines if your attacks are brought on by allergens. Medicines to help  control the body's defense (immune) system. Staying away from the things that cause asthma attacks. Follow these instructions at home: Avoiding triggers in your home Do not allow anyone to smoke in your home. Limit use of fireplaces and wood stoves. Get rid of pests (such as roaches and mice) and their droppings. Keep your home clean. Clean your floors. Dust regularly. Use cleaning products that do not smell. Wash bed sheets and blankets every week in hot water. Dry them in a dryer. Have someone vacuum when you are not home. Change your heating and air conditioning filters often. Use blankets that are made of polyester or cotton. General instructions Take over-the-counter and prescription medicines only as told by your doctor. Do not smoke or use any products that contain nicotine or tobacco. If you need help quitting, ask your doctor. Stay away from secondhand smoke. Avoid doing things outdoors when allergen counts are high and when air quality is low. Warm up before you exercise. Take time to cool down after exercise. Use a peak flow meter as told by your doctor. A peak flow meter is a tool that measures how well your lungs are working. Keep track of the peak flow meter's readings. Write them down. Follow your asthma action plan. This is a written plan for taking care of your asthma and treating your attacks. Make sure you get all the shots (vaccines) that your doctor recommends. Ask your doctor about a flu shot and a pneumonia shot. Keep all follow-up visits. Contact a doctor if: You have wheezing, shortness of breath, or a cough even while taking medicine to prevent attacks. The mucus you cough up (sputum) is thicker than usual. The mucus you cough up changes  from clear or white to yellow, green, gray, or is bloody. You have problems from the medicine you are taking, such as: A rash. Itching. Swelling. Trouble breathing. You need reliever medicines more than 2-3 times a  week. Your peak flow reading is still at 50-79% of your personal best after following the action plan for 1 hour. You have a fever. Get help right away if: You seem to be worse and are not responding to medicine during an asthma attack. You are short of breath even at rest. You get short of breath when doing very little activity. You have trouble eating, drinking, or talking. You have chest pain or tightness. You have a fast heartbeat. Your lips or fingernails start to turn blue. You are light-headed or dizzy, or you faint. Your peak flow is less than 50% of your personal best. You feel too tired to breathe normally. These symptoms may be an emergency. Get help right away. Call 911. Do not wait to see if the symptoms will go away. Do not drive yourself to the hospital. Summary Asthma is a long-term (chronic) condition in which the airways get tight and narrow. An asthma attack can make it hard to breathe. Asthma cannot be cured, but medicines and lifestyle changes can help control it. Make sure you understand how to avoid triggers and how and when to use your medicines. Avoid things that can cause allergy symptoms (allergens). These include animal skin flakes (dander) and pollen from trees or grass. Avoid things that pollute the air. These may include household cleaners, wood smoke, smog, or chemical odors. This information is not intended to replace advice given to you by your health care provider. Make sure you discuss any questions you have with your health care provider. Document Revised: 11/02/2020 Document Reviewed: 11/02/2020 Elsevier Patient Education  2023 [employer].

## 2021-09-30 ENCOUNTER — Other Ambulatory Visit (HOSPITAL_BASED_OUTPATIENT_CLINIC_OR_DEPARTMENT_OTHER): Payer: Self-pay

## 2021-09-30 MED ORDER — CLOBETASOL PROPIONATE 0.05 % EX CREA
TOPICAL_CREAM | CUTANEOUS | 2 refills | Status: DC
  Filled 2021-09-30: qty 30, 10d supply, fill #0

## 2021-10-04 ENCOUNTER — Other Ambulatory Visit (HOSPITAL_BASED_OUTPATIENT_CLINIC_OR_DEPARTMENT_OTHER): Payer: Self-pay

## 2021-10-04 ENCOUNTER — Telehealth (INDEPENDENT_AMBULATORY_CARE_PROVIDER_SITE_OTHER): Payer: 59 | Admitting: Family Medicine

## 2021-10-04 ENCOUNTER — Encounter: Payer: Self-pay | Admitting: Family Medicine

## 2021-10-04 ENCOUNTER — Telehealth: Payer: Self-pay | Admitting: Family Medicine

## 2021-10-04 VITALS — Ht 70.0 in | Wt 236.0 lb

## 2021-10-04 DIAGNOSIS — B349 Viral infection, unspecified: Secondary | ICD-10-CM

## 2021-10-04 DIAGNOSIS — K120 Recurrent oral aphthae: Secondary | ICD-10-CM

## 2021-10-04 MED ORDER — DEXAMETHASONE 0.5 MG/5ML PO SOLN
ORAL | 1 refills | Status: DC
  Filled 2021-10-04: qty 238, 5d supply, fill #0

## 2021-10-04 NOTE — Progress Notes (Signed)
Patient ID: Ronald Foster, male   DOB: [birth year], 31 y.o.   MRN: [id number]   Virtual Visit via Video Note  I connected with Hardie Shackleton on 10/04/21 at  4:00 PM EDT by a video enabled telemedicine application and verified that I am speaking with the correct person using two identifiers.  Location patient: home Location provider:work or home office Persons participating in the virtual visit: patient, provider  I discussed the limitations of evaluation and management by telemedicine and the availability of in person appointments. The patient expressed understanding and agreed to proceed.   HPI:  Patient relates onset Friday of fatigue, chills, sweats, sore throat.  Has been taking some ibuprofen with some relief.  Had COVID test which came back negative.  Also relates some ulcers inside his lower lip.  One on the tongue as well.  Denies prior history of aphthous ulcers.  No significant cough.  No nasal congestion.  No sick contacts.  No recent tick bites.  He did have a tick bite back in early July but none over the past month or so.  No skin rash.   ROS: See pertinent positives and negatives per HPI.  Past Medical History:  Diagnosis Date   Allergy     History reviewed. No pertinent surgical history.  Family History  Problem Relation Age of Onset   Heart disease Mother 38       CAD   Hypertension Father    Heart failure Father    Hypertension Maternal Grandfather     SOCIAL HX:    Current Outpatient Medications:    clobetasol cream (TEMOVATE) 0.05 %, Apply twice daily to the affected areas for 4-6 weeks., Disp: 60 g, Rfl: 2   dexamethasone 0.5 MG/5ML elixir, 10 ml swish for 5 minutes and then spit QID as needed for canker sores., Disp: 240 mL, Rfl: 1  EXAM:  VITALS per patient if applicable:  GENERAL: alert, oriented, appears well and in no acute distress  HEENT: atraumatic, conjunttiva clear, no obvious abnormalities on inspection of external nose and  ears  NECK: normal movements of the head and neck  LUNGS: on inspection no signs of respiratory distress, breathing rate appears normal, no obvious gross SOB, gasping or wheezing  CV: no obvious cyanosis  MS: moves all visible extremities without noticeable abnormality  PSYCH/NEURO: pleasant and cooperative, no obvious depression or anxiety, speech and thought processing grossly intact  ASSESSMENT AND PLAN:  Discussed the following assessment and plan:  Probable viral syndrome.  Home COVID test negative.  He does have what looks like probably some aphthous type ulcers lower lip.    -Plenty fluids and rest -We discussed trial of dexamethasone elixir 10 mL swish and spit 4 times daily. -Recommend in office evaluation if not improving over the next couple days     I discussed the assessment and treatment plan with the patient. The patient was provided an opportunity to ask questions and all were answered. The patient agreed with the plan and demonstrated an understanding of the instructions.   The patient was advised to call back or seek an in-person evaluation if the symptoms worsen or if the condition fails to improve as anticipated.     Evelena Peat, MD

## 2021-10-04 NOTE — Telephone Encounter (Signed)
I spoke with the patient and he has been roomed for his appt today.

## 2021-10-04 NOTE — Telephone Encounter (Signed)
Pt called, citing he may have missed CMA's call. CMA was unavailable. Pt asked that CMA call back at their earliest convenience.

## 2021-10-05 ENCOUNTER — Other Ambulatory Visit (HOSPITAL_BASED_OUTPATIENT_CLINIC_OR_DEPARTMENT_OTHER): Payer: Self-pay

## 2021-10-08 ENCOUNTER — Encounter: Payer: Self-pay | Admitting: Family Medicine

## 2021-10-08 ENCOUNTER — Ambulatory Visit: Payer: 59 | Admitting: Family Medicine

## 2021-10-08 ENCOUNTER — Other Ambulatory Visit (HOSPITAL_BASED_OUTPATIENT_CLINIC_OR_DEPARTMENT_OTHER): Payer: Self-pay

## 2021-10-08 VITALS — BP 136/88 | HR 88 | Temp 98.7°F | Ht 70.0 in | Wt 251.2 lb

## 2021-10-08 DIAGNOSIS — K0510 Chronic gingivitis, plaque induced: Secondary | ICD-10-CM

## 2021-10-08 LAB — CBC WITH DIFFERENTIAL/PLATELET
Basophils Absolute: 0 10*3/uL (ref 0.0–0.1)
Basophils Relative: 0.3 % (ref 0.0–3.0)
Eosinophils Absolute: 0.1 10*3/uL (ref 0.0–0.7)
Eosinophils Relative: 0.9 % (ref 0.0–5.0)
HCT: 46.9 % (ref 39.0–52.0)
Hemoglobin: 15.8 g/dL (ref 13.0–17.0)
Lymphocytes Relative: 29.6 % (ref 12.0–46.0)
Lymphs Abs: 3.3 10*3/uL (ref 0.7–4.0)
MCHC: 33.7 g/dL (ref 30.0–36.0)
MCV: 87.8 fl (ref 78.0–100.0)
Monocytes Absolute: 0.9 10*3/uL (ref 0.1–1.0)
Monocytes Relative: 7.8 % (ref 3.0–12.0)
Neutro Abs: 6.8 10*3/uL (ref 1.4–7.7)
Neutrophils Relative %: 61.4 % (ref 43.0–77.0)
Platelets: 240 10*3/uL (ref 150.0–400.0)
RBC: 5.34 Mil/uL (ref 4.22–5.81)
RDW: 11.5 % (ref 11.5–15.5)
WBC: 11.1 10*3/uL — ABNORMAL HIGH (ref 4.0–10.5)

## 2021-10-08 MED ORDER — VALACYCLOVIR HCL 1 G PO TABS
1000.0000 mg | ORAL_TABLET | Freq: Three times a day (TID) | ORAL | 0 refills | Status: DC
  Filled 2021-10-08: qty 21, 7d supply, fill #0

## 2021-10-08 MED ORDER — NYSTATIN 100000 UNIT/ML MT SUSP
10.0000 mL | Freq: Four times a day (QID) | OROMUCOSAL | 0 refills | Status: DC | PRN
  Filled 2021-10-08: qty 240, 6d supply, fill #0

## 2021-10-08 NOTE — Progress Notes (Signed)
Established Patient Office Visit  Subjective   Patient ID: Ronald Foster, male    DOB: [birth year]  Age: 31 y.o. MRN: [id number]  Chief Complaint  Patient presents with   Mouth Lesions    Patient complains of mouth lesions, x4 days     HPI   Seen with some mouth lesions especially involving tongue and lower inner lip.  He states he has some chronic gingivitis issues.  He has noted more irritation of his gums recently but especially the new skin lesions involving the lower lip.  He tried some Decadron elixir starting few days ago but feels like if anything that may have made his symptoms worse.  He denies any history of herpes.  Denies any recent fever or chills.  Somewhat diminished appetite.  Denies any other skin lesions.  No urinary symptoms.  No penile discharge.  No recent antibiotics.  Non-smoker except for rare cigar.  No new partners over the past month.  Past Medical History:  Diagnosis Date   Allergy    History reviewed. No pertinent surgical history.  reports that he has never smoked. He has never used smokeless tobacco. He reports that he does not drink alcohol and does not use drugs. family history includes Heart disease (age of onset: 91) in his mother; Heart failure in his father; Hypertension in his father and maternal grandfather. No Known Allergies  Review of Systems  Constitutional:  Negative for chills, fever and weight loss.  HENT:  Negative for sinus pain and sore throat.   Respiratory:  Negative for shortness of breath.       Objective:     BP 136/88 (BP Location: Left Arm, Patient Position: Sitting, Cuff Size: Large)   Pulse 88   Temp 98.7 F (37.1 C) (Oral)   Ht 5\' 10"  (1.778 m)   Wt 251 lb 3.2 oz (113.9 kg)   SpO2 98%   BMI 36.04 kg/m    Physical Exam Vitals reviewed.  Constitutional:      Appearance: Normal appearance.  HENT:     Mouth/Throat:     Comments: He has a few scattered shallow appearing ulcers lower inner lip.  He  has a couple similar ulcers left side of tongue distally.  Very inflamed gums diffusely in a couple areas of bleeding.  Posterior pharynx appears clear.  No exudate.  No visible buccal mucosal lesions. Cardiovascular:     Rate and Rhythm: Normal rate.  Musculoskeletal:     Cervical back: Neck supple.  Neurological:     Mental Status: He is alert.      Results for orders placed or performed in visit on 10/08/21  CBC with Differential/Platelet  Result Value Ref Range   WBC 11.1 (H) 4.0 - 10.5 K/uL   RBC 5.34 4.22 - 5.81 Mil/uL   Hemoglobin 15.8 13.0 - 17.0 g/dL   HCT 12/08/21 41.6 - 60.6 %   MCV 87.8 78.0 - 100.0 fl   MCHC 33.7 30.0 - 36.0 g/dL   RDW 30.1 60.1 - 09.3 %   Platelets 240.0 150.0 - 400.0 K/uL   Neutrophils Relative % 61.4 43.0 - 77.0 %   Lymphocytes Relative 29.6 12.0 - 46.0 %   Monocytes Relative 7.8 3.0 - 12.0 %   Eosinophils Relative 0.9 0.0 - 5.0 %   Basophils Relative 0.3 0.0 - 3.0 %   Neutro Abs 6.8 1.4 - 7.7 K/uL   Lymphs Abs 3.3 0.7 - 4.0 K/uL   Monocytes Absolute 0.9 0.1 -  1.0 K/uL   Eosinophils Absolute 0.1 0.0 - 0.7 K/uL   Basophils Absolute 0.0 0.0 - 0.1 K/uL      The ASCVD Risk score (Arnett DK, et al., 2019) failed to calculate for the following reasons:   The 2019 ASCVD risk score is only valid for ages 78 to 61    Assessment & Plan:   Problem List Items Addressed This Visit   None Visit Diagnoses     Gingivostomatitis    -  Primary   Relevant Orders   CBC with Differential/Platelet (Completed)   HIV antibody (with reflex)     Patient relates onset earlier in the week of gingivostomatitis symptoms.  Worsening through the week.  No reported fever.  Poor appetite. -Check labs as above with CBC and HIV -Start Valtrex 1 g 3 times daily for 7 days -Magic mouthwash-use several times daily as needed -Soft foods as tolerated and plenty of fluids -Follow-up with primary by next week if not improving  No follow-ups on file.    Evelena Peat,  MD

## 2021-10-11 LAB — HIV ANTIBODY (ROUTINE TESTING W REFLEX): HIV 1&2 Ab, 4th Generation: NONREACTIVE

## 2022-04-01 NOTE — Progress Notes (Unsigned)
HPI: Ronald Foster is a 32 y.o.male here today for his routine physical examination.  Last CPE: 02/17/21  He has been exercising regularly, engaging in cardio and high-intensity interval training three to four times per week.  His diet consists of a mix of eating out and cooking at home, with daily vegetable intake and a primary focus on chicken, beef, or fish, particularly salmon.  He denies smoking and reports consuming alcohol only socially.  He reports no new problems since the last visit. However, he has been experiencing fatigue and a decreased desire to go to the gym for the past few weeks. She was working longer hours, back to his normal work schedule.  He has been sleeping for six to seven hours per night. He does not feel rested upon waking. No known hx of OSA. He denies depressed mood or anxiety.  Immunization History  Administered Date(s) Administered   PPD Test 06/20/2018   Tdap 01/24/2011   Health Maintenance  Topic Date Due   DTaP/Tdap/Td (2 - Td or Tdap) 01/23/2021   COVID-19 Vaccine (1) 04/17/2022 (Originally 03/20/1991)   INFLUENZA VACCINE  05/08/2022 (Originally 09/07/2021)   Hepatitis C Screening  Completed   HIV Screening  Completed   HPV VACCINES  Aged Out   He mentions that he had a retinal detachment surgery last year, unknown etiology. He has follow-up appointments scheduled every six months.  Today he is requesting blood work, he would like vit D and testosterone check as well as glucose and STD screening.  Review of Systems  Constitutional:  Positive for fatigue. Negative for activity change, appetite change and fever.  HENT:  Negative for nosebleeds, sore throat and trouble swallowing.   Eyes:  Negative for redness and visual disturbance.  Respiratory:  Negative for cough, shortness of breath and wheezing.   Cardiovascular:  Negative for chest pain, palpitations and leg swelling.  Gastrointestinal:  Negative for abdominal pain, blood  in stool, nausea and vomiting.  Endocrine: Negative for cold intolerance, heat intolerance, polydipsia, polyphagia and polyuria.  Genitourinary:  Negative for decreased urine volume, dysuria, genital sores, hematuria and testicular pain.  Musculoskeletal:  Negative for gait problem and myalgias.  Skin:  Negative for color change and rash.  Allergic/Immunologic: Positive for environmental allergies.  Neurological:  Negative for syncope, weakness, numbness and headaches.  Hematological:  Negative for adenopathy. Does not bruise/bleed easily.  Psychiatric/Behavioral:  Negative for confusion and sleep disturbance.   All other systems reviewed and are negative.  No current outpatient medications on file prior to visit.   No current facility-administered medications on file prior to visit.   Past Medical History:  Diagnosis Date   Allergy    History reviewed. No pertinent surgical history.  No Known Allergies  Family History  Problem Relation Age of Onset   Heart disease Mother 6       CAD   Hypertension Father    Heart failure Father    Hypertension Maternal Grandfather    Social History   Socioeconomic History   Marital status: Single    Spouse name: Not on file   Number of children: Not on file   Years of education: Not on file   Highest education level: Not on file  Occupational History   Not on file  Tobacco Use   Smoking status: Never   Smokeless tobacco: Never  Substance and Sexual Activity   Alcohol use: No    Alcohol/week: 0.0 standard drinks of alcohol  Drug use: No   Sexual activity: Not on file  Other Topics Concern   Not on file  Social History Narrative   Not on file   Social Determinants of Health   Financial Resource Strain: Not on file  Food Insecurity: Not on file  Transportation Needs: Not on file  Physical Activity: Not on file  Stress: Not on file  Social Connections: Not on file   Vitals:   04/04/22 1050  BP: 126/80  Pulse: 75   Resp: 12  SpO2: 98%  Body mass index is 34.72 kg/m. Wt Readings from Last 3 Encounters:  04/04/22 242 lb (109.8 kg)  10/08/21 251 lb 3.2 oz (113.9 kg)  10/04/21 236 lb (107 kg)   Physical Exam Vitals and nursing note reviewed.  Constitutional:      General: He is not in acute distress.    Appearance: He is well-developed.  HENT:     Head: Normocephalic and atraumatic.     Right Ear: External ear normal.     Left Ear: External ear normal. Tympanic membrane is not erythematous.     Ears:     Comments: Cerumen excess R>L, could see left TM partially. Eyes:     Extraocular Movements: Extraocular movements intact.     Conjunctiva/sclera: Conjunctivae normal.     Pupils: Pupils are equal, round, and reactive to light.  Neck:     Thyroid: No thyromegaly.     Trachea: No tracheal deviation.  Cardiovascular:     Rate and Rhythm: Normal rate and regular rhythm.     Pulses:          Dorsalis pedis pulses are 2+ on the right side and 2+ on the left side.     Heart sounds: No murmur heard. Pulmonary:     Effort: Pulmonary effort is normal. No respiratory distress.     Breath sounds: Normal breath sounds.  Abdominal:     Palpations: Abdomen is soft. There is no hepatomegaly or mass.     Tenderness: There is no abdominal tenderness.  Genitourinary:    Comments: No concerns. Musculoskeletal:        General: No tenderness.     Cervical back: Normal range of motion.     Comments: No major deformities appreciated and no signs of synovitis.  Lymphadenopathy:     Cervical: No cervical adenopathy.     Upper Body:     Right upper body: No supraclavicular adenopathy.     Left upper body: No supraclavicular adenopathy.  Skin:    General: Skin is warm.     Findings: No erythema.  Neurological:     General: No focal deficit present.     Mental Status: He is alert and oriented to person, place, and time.     Cranial Nerves: No cranial nerve deficit.     Sensory: No sensory deficit.      Gait: Gait normal.     Deep Tendon Reflexes:     Reflex Scores:      Bicep reflexes are 2+ on the right side and 2+ on the left side.      Patellar reflexes are 2+ on the right side and 2+ on the left side. Psychiatric:        Mood and Affect: Mood and affect normal.   ASSESSMENT AND PLAN:  Mr. Karim was seen today for annual exam.  Diagnoses and all orders for this visit: Orders Placed This Encounter  Procedures   TSH   HIV  Antibody (routine testing w rflx)   RPR   Basic metabolic panel   VITAMIN D 25 Hydroxy (Vit-D Deficiency, Fractures)   Testosterone   CBC   Routine general medical examination at a health care facility Assessment & Plan: We discussed the importance of regular physical activity and healthy diet for prevention of chronic illness and/or complications. Preventive guidelines reviewed. Vaccination up-to-date. Next CPE in a year.   Screen for STD (sexually transmitted disease) -     Urine cytology ancillary only -     HIV Antibody (routine testing w rflx); Future -     RPR; Future  Patient request for diagnostic testing -     VITAMIN D 25 Hydroxy (Vit-D Deficiency, Fractures); Future -     Testosterone  Other fatigue Assessment & Plan: We discussed possible etiologies. History and examination do not suggest a serious process. Problem started at the time he worked longer hours. Monitor for new symptoms. Further recommendation will be given according to lab results.  Orders: -     TSH; Future -     Basic metabolic panel; Future -     CBC; Future   Return in 1 year (on 04/05/2023) for CPE.  Zayn Selley G. [place], MD  [facility]. [place] office.

## 2022-04-04 ENCOUNTER — Encounter: Payer: Self-pay | Admitting: Family Medicine

## 2022-04-04 ENCOUNTER — Ambulatory Visit (INDEPENDENT_AMBULATORY_CARE_PROVIDER_SITE_OTHER): Payer: 59 | Admitting: Family Medicine

## 2022-04-04 ENCOUNTER — Other Ambulatory Visit (HOSPITAL_COMMUNITY)
Admission: RE | Admit: 2022-04-04 | Discharge: 2022-04-04 | Disposition: A | Discharge status: Home / Self Care | Payer: 59 | Source: Ambulatory Visit | Attending: Family Medicine | Admitting: Family Medicine

## 2022-04-04 VITALS — BP 126/80 | HR 75 | Resp 12 | Ht 70.0 in | Wt 242.0 lb

## 2022-04-04 DIAGNOSIS — R5383 Other fatigue: Secondary | ICD-10-CM

## 2022-04-04 DIAGNOSIS — Z0189 Encounter for other specified special examinations: Secondary | ICD-10-CM | POA: Diagnosis not present

## 2022-04-04 DIAGNOSIS — Z0000 Encounter for general adult medical examination without abnormal findings: Secondary | ICD-10-CM | POA: Diagnosis not present

## 2022-04-04 DIAGNOSIS — Z113 Encounter for screening for infections with a predominantly sexual mode of transmission: Secondary | ICD-10-CM | POA: Insufficient documentation

## 2022-04-04 LAB — BASIC METABOLIC PANEL
BUN: 17 mg/dL (ref 6–23)
CO2: 31 mEq/L (ref 19–32)
Calcium: 10.2 mg/dL (ref 8.4–10.5)
Chloride: 99 mEq/L (ref 96–112)
Creatinine, Ser: 0.97 mg/dL (ref 0.40–1.50)
GFR: 104 mL/min (ref >60.00)
Glucose, Bld: 85 mg/dL (ref 70–99)
Potassium: 4.6 mEq/L (ref 3.5–5.1)
Sodium: 137 mEq/L (ref 135–145)

## 2022-04-04 LAB — CBC
HCT: 49 % (ref 39.0–52.0)
Hemoglobin: 16.5 g/dL (ref 13.0–17.0)
MCHC: 33.7 g/dL (ref 30.0–36.0)
MCV: 87.9 fl (ref 78.0–100.0)
Platelets: 232 10*3/uL (ref 150.0–400.0)
RBC: 5.58 Mil/uL (ref 4.22–5.81)
RDW: 12.4 % (ref 11.5–15.5)
WBC: 8.9 10*3/uL (ref 4.0–10.5)

## 2022-04-04 LAB — TESTOSTERONE: Testosterone: 345.88 ng/dL (ref 300.00–890.00)

## 2022-04-04 LAB — VITAMIN D 25 HYDROXY (VIT D DEFICIENCY, FRACTURES): VITD: 22.83 ng/mL — ABNORMAL LOW (ref 30.00–100.00)

## 2022-04-04 LAB — TSH: TSH: 2.44 u[IU]/mL (ref 0.35–5.50)

## 2022-04-04 NOTE — Assessment & Plan Note (Signed)
We discussed the importance of regular physical activity and healthy diet for prevention of chronic illness and/or complications. Preventive guidelines reviewed. Vaccination up-to-date. Next CPE in a year.

## 2022-04-04 NOTE — Patient Instructions (Addendum)
A few things to remember from today's visit:  Routine general medical examination at a health care facility  Screen for STD (sexually transmitted disease) - Plan: Urine cytology ancillary only, HIV Antibody (routine testing w rflx), RPR  Patient request for diagnostic testing - Plan: VITAMIN D 25 Hydroxy (Vit-D Deficiency, Fractures), Testosterone  Other fatigue - Plan: TSH, Basic metabolic panel  If you need refills for medications you take chronically, please call your pharmacy. Do not use My Chart to request refills or for acute issues that need immediate attention. If you send a my chart message, it may take a few days to be addressed, specially if I am not in the office.  Please be sure medication list is accurate. If a new problem present, please set up appointment sooner than planned today.  Health Maintenance, Male Adopting a healthy lifestyle and getting preventive care are important in promoting health and wellness. Ask your health care provider about: The right schedule for you to have regular tests and exams. Things you can do on your own to prevent diseases and keep yourself healthy. What should I know about diet, weight, and exercise? Eat a healthy diet  Eat a diet that includes plenty of vegetables, fruits, low-fat dairy products, and lean protein. Do not eat a lot of foods that are high in solid fats, added sugars, or sodium. Maintain a healthy weight Body mass index (BMI) is a measurement that can be used to identify possible weight problems. It estimates body fat based on height and weight. Your health care provider can help determine your BMI and help you achieve or maintain a healthy weight. Get regular exercise Get regular exercise. This is one of the most important things you can do for your health. Most adults should: Exercise for at least 150 minutes each week. The exercise should increase your heart rate and make you sweat (moderate-intensity exercise). Do  strengthening exercises at least twice a week. This is in addition to the moderate-intensity exercise. Spend less time sitting. Even light physical activity can be beneficial. Watch cholesterol and blood lipids Have your blood tested for lipids and cholesterol at 32 years of age, then have this test every 5 years. You may need to have your cholesterol levels checked more often if: Your lipid or cholesterol levels are high. You are older than 32 years of age. You are at high risk for heart disease. What should I know about cancer screening? Many types of cancers can be detected early and may often be prevented. Depending on your health history and family history, you may need to have cancer screening at various ages. This may include screening for: Colorectal cancer. Prostate cancer. Skin cancer. Lung cancer. What should I know about heart disease, diabetes, and high blood pressure? Blood pressure and heart disease High blood pressure causes heart disease and increases the risk of stroke. This is more likely to develop in people who have high blood pressure readings or are overweight. Talk with your health care provider about your target blood pressure readings. Have your blood pressure checked: Every 3-5 years if you are 32-30 years of age. Every year if you are 54 years old or older. If you are between the ages of 66 and 25 and are a current or former smoker, ask your health care provider if you should have a one-time screening for abdominal aortic aneurysm (AAA). Diabetes Have regular diabetes screenings. This checks your fasting blood sugar level. Have the screening done: Once every three  years after age 58 if you are at a normal weight and have a low risk for diabetes. More often and at a younger age if you are overweight or have a high risk for diabetes. What should I know about preventing infection? Hepatitis B If you have a higher risk for hepatitis B, you should be screened for  this virus. Talk with your health care provider to find out if you are at risk for hepatitis B infection. Hepatitis C Blood testing is recommended for: Everyone born from [birth year] through 1965. Anyone with known risk factors for hepatitis C. Sexually transmitted infections (STIs) You should be screened each year for STIs, including gonorrhea and chlamydia, if: You are sexually active and are younger than 32 years of age. You are older than 32 years of age and your health care provider tells you that you are at risk for this type of infection. Your sexual activity has changed since you were last screened, and you are at increased risk for chlamydia or gonorrhea. Ask your health care provider if you are at risk. Ask your health care provider about whether you are at high risk for HIV. Your health care provider may recommend a prescription medicine to help prevent HIV infection. If you choose to take medicine to prevent HIV, you should first get tested for HIV. You should then be tested every 3 months for as long as you are taking the medicine. Follow these instructions at home: Alcohol use Do not drink alcohol if your health care provider tells you not to drink. If you drink alcohol: Limit how much you have to 0-2 drinks a day. Know how much alcohol is in your drink. In the [place] one drink equals one 12 oz bottle of beer (355 mL), one 5 oz glass of wine (148 mL), or one 1 oz glass of hard liquor (44 mL). Lifestyle Do not use any products that contain nicotine or tobacco. These products include cigarettes, chewing tobacco, and vaping devices, such as e-cigarettes. If you need help quitting, ask your health care provider. Do not use street drugs. Do not share needles. Ask your health care provider for help if you need support or information about quitting drugs. General instructions Schedule regular health, dental, and eye exams. Stay current with your vaccines. Tell your health care provider  if: You often feel depressed. You have ever been abused or do not feel safe at home. Summary Adopting a healthy lifestyle and getting preventive care are important in promoting health and wellness. Follow your health care provider's instructions about healthy diet, exercising, and getting tested or screened for diseases. Follow your health care provider's instructions on monitoring your cholesterol and blood pressure. This information is not intended to replace advice given to you by your health care provider. Make sure you discuss any questions you have with your health care provider. Document Revised: 06/15/2020 Document Reviewed: 06/15/2020 Elsevier Patient Education  [place].

## 2022-04-04 NOTE — Assessment & Plan Note (Signed)
We discussed possible etiologies. History and examination do not suggest a serious process. Problem started at the time he worked longer hours. Monitor for new symptoms. Further recommendation will be given according to lab results.

## 2022-04-05 LAB — RPR: RPR Ser Ql: NONREACTIVE

## 2022-04-05 LAB — URINE CYTOLOGY ANCILLARY ONLY
Chlamydia: NEGATIVE
Comment: NEGATIVE
Comment: NEGATIVE
Comment: NORMAL
Neisseria Gonorrhea: NEGATIVE
Trichomonas: NEGATIVE

## 2022-04-05 LAB — HIV ANTIBODY (ROUTINE TESTING W REFLEX): HIV 1&2 Ab, 4th Generation: NONREACTIVE

## 2022-09-05 ENCOUNTER — Telehealth: Payer: Self-pay | Admitting: Family Medicine

## 2022-09-05 DIAGNOSIS — Z13220 Encounter for screening for lipoid disorders: Secondary | ICD-10-CM

## 2022-09-05 NOTE — Telephone Encounter (Signed)
[facility] and [facility] form to be filled out--placed in dr's folder.  Please fax form to number highlighted on form.

## 2022-09-05 NOTE — Telephone Encounter (Signed)
Need updated lipid panel, called and left patient a voicemail to call the office back to schedule a lab appt.

## 2022-09-09 NOTE — Telephone Encounter (Signed)
Need updated lipid panel, called and left patient a voicemail to call the office back to schedule a lab appt.

## 2022-09-12 NOTE — Telephone Encounter (Signed)
Lab appt has been made - will fill out values & fax form once lab results return.

## 2022-09-15 ENCOUNTER — Other Ambulatory Visit (INDEPENDENT_AMBULATORY_CARE_PROVIDER_SITE_OTHER): Payer: 59

## 2022-09-15 ENCOUNTER — Telehealth: Payer: Self-pay | Admitting: Family Medicine

## 2022-09-15 DIAGNOSIS — Z13220 Encounter for screening for lipoid disorders: Secondary | ICD-10-CM | POA: Diagnosis not present

## 2022-09-15 LAB — LIPID PANEL
Cholesterol: 185 mg/dL (ref 0–200)
HDL: 60.7 mg/dL (ref >39.00)
LDL Cholesterol: 105 mg/dL — ABNORMAL HIGH (ref 0–99)
NonHDL: 124.75
Total CHOL/HDL Ratio: 3
Triglycerides: 98 mg/dL (ref 0.0–149.0)
VLDL: 19.6 mg/dL (ref 0.0–40.0)

## 2022-09-15 NOTE — Telephone Encounter (Signed)
Physician Visit Form-[facility] form to be filled out--placed in dr's folder.  Please fax to instructions on form upon completion.

## 2022-09-16 NOTE — Telephone Encounter (Signed)
Forms signed. BJ

## 2022-09-19 NOTE — Telephone Encounter (Signed)
Form was faxed. Received a confirmation.   Pt was notified.

## 2022-09-20 NOTE — Telephone Encounter (Signed)
Faxed & copy sent to scan.  

## 2023-02-09 ENCOUNTER — Telehealth: Payer: Self-pay | Admitting: *Deleted

## 2023-02-09 NOTE — Telephone Encounter (Signed)
 Copied from CRM [id number]. Topic: Clinical - Request for Lab/Test Order >> Feb 09, 2023 10:02 AM Corin V wrote: Reason for CRM: Patient is wanting to get his testosterone  checked with his annual physical on 04/10/23. Please let patient know if he needs to have that lab done before the physical.

## 2023-02-15 NOTE — Telephone Encounter (Signed)
 We can discuss if there is an indication to have it check, if not his health insurance may not cover test. We can certainly add it to labs done same day of the visit. Thanks, BJ

## 2023-02-15 NOTE — Telephone Encounter (Signed)
 I called and spoke with pt. He is aware lab can be done during his physical.

## 2023-02-27 NOTE — Progress Notes (Signed)
   ACUTE VISIT No chief complaint on file.  HPI: [name] is a 33 y.o. male with a PMHx significant for fatigue, who is here today for STD testing.   Review of Systems See other pertinent positives and negatives in HPI.  No current outpatient medications on file prior to visit.   No current facility-administered medications on file prior to visit.    Past Medical History:  Diagnosis Date   Allergy    No Known Allergies  Social History   Socioeconomic History   Marital status: Single    Spouse name: Not on file   Number of children: Not on file   Years of education: Not on file   Highest education level: Not on file  Occupational History   Not on file  Tobacco Use   Smoking status: Never   Smokeless tobacco: Never  Substance and Sexual Activity   Alcohol use: No    Alcohol/week: 0.0 standard drinks of alcohol   Drug use: No   Sexual activity: Not on file  Other Topics Concern   Not on file  Social History Narrative   Not on file   Social Drivers of Health   Financial Resource Strain: Low Risk  (08/03/2017)   Received from [facility], [facility]   Overall Financial Resource Strain (CARDIA)    Difficulty of Paying Living Expenses: Not hard at all  Food Insecurity: No Food Insecurity (08/03/2017)   Received from [facility], [facility]   Hunger Vital Sign    Worried About Running Out of Food in the Last Year: Never true    Ran Out of Food in the Last Year: Never true  Transportation Needs: No Transportation Needs (08/03/2017)   Received from [facility], [facility]   PRAPARE - Transportation    Lack of Transportation (Medical): No    Lack of Transportation (Non-Medical): No  Physical Activity: Not on file  Stress: Not on file  Social Connections: Not on file    There were no vitals filed for this visit. There is no height or weight on file to calculate BMI.  Physical Exam  ASSESSMENT AND PLAN:  Ronald Foster was seen  today for STD testing.   There are no diagnoses linked to this encounter.  No follow-ups on file.  I, Rolla Etienne Wierda, acting as a scribe for Genie Mirabal [place], MD., have documented all relevant documentation on the behalf of Ronald Perot [place], MD, as directed by  Ronald Prest [place], MD while in the presence of Ronald Seeley [place], MD.   I, Kymari Lollis [place], MD, have reviewed all documentation for this visit. The documentation on 02/27/23 for the exam, diagnosis, procedures, and orders are all accurate and complete.  Octavia Mottola G. [place], MD  [facility]. [place] [facility].  Discharge Instructions   None

## 2023-02-28 ENCOUNTER — Ambulatory Visit: Payer: 59 | Admitting: Family Medicine

## 2023-02-28 ENCOUNTER — Other Ambulatory Visit (HOSPITAL_COMMUNITY)
Admission: RE | Admit: 2023-02-28 | Discharge: 2023-02-28 | Disposition: A | Discharge status: Home / Self Care | Payer: 59 | Source: Ambulatory Visit | Attending: Family Medicine | Admitting: Family Medicine

## 2023-02-28 ENCOUNTER — Encounter: Payer: Self-pay | Admitting: Family Medicine

## 2023-02-28 VITALS — BP 110/70 | HR 76 | Resp 12 | Ht 70.0 in | Wt 254.0 lb

## 2023-02-28 DIAGNOSIS — Z113 Encounter for screening for infections with a predominantly sexual mode of transmission: Secondary | ICD-10-CM

## 2023-02-28 NOTE — Patient Instructions (Addendum)
A few things to remember from today's visit:  Screen for STD (sexually transmitted disease) - Plan: HIV Antibody (routine testing w rflx), RPR, Urine cytology ancillary only, HSV(herpes simplex vrs) 1+2 ab-IgG   Do not use My Chart to request refills or for acute issues that need immediate attention. If you send a my chart message, it may take a few days to be addressed, specially if I am not in the office.  Please be sure medication list is accurate. If a new problem present, please set up appointment sooner than planned today.

## 2023-03-01 LAB — URINE CYTOLOGY ANCILLARY ONLY
Chlamydia: NEGATIVE
Comment: NEGATIVE
Comment: NEGATIVE
Comment: NORMAL
Neisseria Gonorrhea: NEGATIVE
Trichomonas: NEGATIVE

## 2023-03-02 LAB — HSV(HERPES SIMPLEX VRS) I + II AB-IGG
HSV 1 IGG,TYPE SPECIFIC AB: 30.8 {index} — ABNORMAL HIGH
HSV 2 IGG,TYPE SPECIFIC AB: <0.9 {index}

## 2023-03-02 LAB — HIV ANTIBODY (ROUTINE TESTING W REFLEX): HIV 1&2 Ab, 4th Generation: NONREACTIVE

## 2023-03-02 LAB — RPR: RPR Ser Ql: NONREACTIVE

## 2023-03-07 ENCOUNTER — Encounter: Payer: Self-pay | Admitting: Family Medicine

## 2023-04-07 NOTE — Progress Notes (Signed)
 HPI: [name] is a 33 y.o.male with a PMHx significant for fatigue and elevated blood pressure, who is here today for his routine physical examination.  Last CPE: 04/04/2022  Exercise: Patient states he does a boxing workout most days.  Diet: He is eating out more often lately. He cooks at home 1-2 times per week. Eats vegetables daily. He doesn't snack much.  Sleep: 6-7 hours per night.  Alcohol Use: Social use. He says he may drink 6+ drinks once every 3 months. He has decreased his input this year compared to last year.  Smoking: never Vision: UTD on routine vision care.  Dental: UTD on routine dental care.   Immunization History  Administered Date(s) Administered   PPD Test 06/20/2018   Tdap 01/24/2011   Health Maintenance  Topic Date Due   Pneumococcal Vaccine 99-56 Years old (1 of 2 - PCV) Never done   DTaP/Tdap/Td (2 - Td or Tdap) 01/23/2021   COVID-19 Vaccine (1 - 2024-25 season) 04/26/2023 (Originally 10/09/2022)   INFLUENZA VACCINE  05/08/2023 (Originally 09/08/2022)   Hepatitis C Screening  Completed   HIV Screening  Completed   HPV VACCINES  Aged Out   Chronic medical problems:   Cholesterol:  Lab Results  Component Value Date   CHOL 185 09/15/2022   HDL 60.70 09/15/2022   LDLCALC 105 (H) 09/15/2022   TRIG 98.0 09/15/2022   CHOLHDL 3 09/15/2022   Concerns today:   He complains of occasional fatigue, and would like to have his testosterone checked.  He works 12 hour shifts and occasionally does several in a row.  Negative for depressed mood or ED.  Lab Results  Component Value Date   TESTOSTERONE 345.88 04/04/2022   Vit D deficiency:He has not been taking vitamin D lately.  Lab Results  Component Value Date   VD25OH 22.83 (L) 04/04/2022   He would also like a referral to urology to discuss getting a vasectomy.   Patient additionally request and STD panel.  No known exposure recently. He says he has been wearing condoms. Denies  any penile lesions.   Review of Systems  Constitutional:  Positive for fatigue. Negative for activity change, appetite change and fever.  HENT:  Negative for nosebleeds, sore throat and trouble swallowing.   Eyes:  Negative for redness and visual disturbance.  Respiratory:  Negative for cough, shortness of breath and wheezing.   Cardiovascular:  Negative for chest pain, palpitations and leg swelling.  Gastrointestinal:  Negative for abdominal pain, blood in stool, nausea and vomiting.  Endocrine: Negative for cold intolerance, heat intolerance, polydipsia, polyphagia and polyuria.  Genitourinary:  Negative for decreased urine volume, dysuria, genital sores, hematuria, penile discharge and testicular pain.  Musculoskeletal:  Negative for arthralgias and myalgias.  Skin:  Negative for color change and rash.  Allergic/Immunologic: Positive for environmental allergies.  Neurological:  Negative for syncope, weakness and headaches.  Hematological:  Negative for adenopathy. Does not bruise/bleed easily.  Psychiatric/Behavioral:  Negative for confusion and hallucinations.    No current outpatient medications on file prior to visit.   No current facility-administered medications on file prior to visit.   Past Medical History:  Diagnosis Date   Allergy    Past Surgical History:  Procedure Laterality Date   EYE SURGERY  2023   Retina detachment surgery   No Known Allergies  Family History  Problem Relation Age of Onset   Heart disease Mother 56       CAD  Obesity Mother    Hypertension Father    Heart failure Father    Hypertension Maternal Grandfather    Social History   Socioeconomic History   Marital status: Single    Spouse name: Not on file   Number of children: Not on file   Years of education: Not on file   Highest education level: Bachelor's degree (e.g., BA, AB, BS)  Occupational History   Not on file  Tobacco Use   Smoking status: Never   Smokeless tobacco: Never   Substance and Sexual Activity   Alcohol use: No   Drug use: No   Sexual activity: Yes    Birth control/protection: Condom  Other Topics Concern   Not on file  Social History Narrative   Not on file   Social Drivers of Health   Financial Resource Strain: Low Risk  (02/27/2023)   Overall Financial Resource Strain (CARDIA)    Difficulty of Paying Living Expenses: Not very hard  Food Insecurity: No Food Insecurity (02/27/2023)   Hunger Vital Sign    Worried About Running Out of Food in the Last Year: Never true    Ran Out of Food in the Last Year: Never true  Transportation Needs: No Transportation Needs (02/27/2023)   PRAPARE - Administrator, Civil Service (Medical): No    Lack of Transportation (Non-Medical): No  Physical Activity: Insufficiently Active (02/27/2023)   Exercise Vital Sign    Days of Exercise per Week: 3 days    Minutes of Exercise per Session: 30 min  Stress: No Stress Concern Present (02/27/2023)   [employer] of Occupational Health - Occupational Stress Questionnaire    Feeling of Stress : Only a little  Social Connections: Moderately Isolated (02/27/2023)   Social Connection and Isolation Panel [NHANES]    Frequency of Communication with Friends and Family: More than three times a week    Frequency of Social Gatherings with Friends and Family: Once a week    Attends Religious Services: More than 4 times per year    Active Member of [employer] or Organizations: No    Attends Banker Meetings: Not on file    Marital Status: Divorced   Vitals:   04/10/23 0817  BP: 118/70  Pulse: 85  Resp: 12  SpO2: 97%   Body mass index is 36.16 kg/m.  Wt Readings from Last 3 Encounters:  04/10/23 252 lb (114.3 kg)  02/28/23 254 lb (115.2 kg)  04/04/22 242 lb (109.8 kg)   Physical Exam Vitals and nursing note reviewed.  Constitutional:      General: He is not in acute distress.    Appearance: He is well-developed.  HENT:     Head:  Normocephalic and atraumatic.     Right Ear: External ear normal.     Left Ear: External ear normal. Tympanic membrane is not erythematous.     Ears:     Comments: Cerumen excess bilateral, left TM seen partially.    Mouth/Throat:     Mouth: Mucous membranes are moist.     Pharynx: Oropharynx is clear. Uvula midline.  Eyes:     Intraocular pressure: Measurements were taken using an automated tonometer.    Extraocular Movements: Extraocular movements intact.     Conjunctiva/sclera: Conjunctivae normal.     Pupils: Pupils are equal, round, and reactive to light.  Neck:     Thyroid: No thyroid mass or thyromegaly.  Cardiovascular:     Rate and Rhythm: Normal rate and  regular rhythm.     Pulses:          Dorsalis pedis pulses are 2+ on the right side and 2+ on the left side.     Heart sounds: No murmur heard. Pulmonary:     Effort: Pulmonary effort is normal. No respiratory distress.     Breath sounds: Normal breath sounds.  Abdominal:     Palpations: Abdomen is soft. There is no hepatomegaly or mass.     Tenderness: There is no abdominal tenderness.  Genitourinary:    Comments: No concerns. Musculoskeletal:        General: No tenderness.     Cervical back: Normal range of motion.     Comments: No major deformities appreciated and no signs of synovitis.  Lymphadenopathy:     Cervical: No cervical adenopathy.     Upper Body:     Right upper body: No supraclavicular adenopathy.     Left upper body: No supraclavicular adenopathy.  Skin:    General: Skin is warm.     Findings: No erythema.  Neurological:     General: No focal deficit present.     Mental Status: He is alert and oriented to person, place, and time.     Cranial Nerves: No cranial nerve deficit.     Sensory: No sensory deficit.     Motor: No weakness.     Gait: Gait normal.     Deep Tendon Reflexes:     Reflex Scores:      Bicep reflexes are 2+ on the right side and 2+ on the left side.      Patellar reflexes  are 2+ on the right side and 2+ on the left side. Psychiatric:        Mood and Affect: Mood and affect normal.    ASSESSMENT AND PLAN:  Mr. Pagliarulo was seen today for his routine general medical examination.   Orders Placed This Encounter  Procedures   Testosterone   Comprehensive metabolic panel   CBC   Hemoglobin A1c   TSH   VITAMIN D 25 Hydroxy (Vit-D Deficiency, Fractures)   HSV-2 Ab, IgG   RPR   HIV Antibody (routine testing w rflx)   Lipid panel   Ambulatory referral to Urology   Lab Results  Component Value Date   TESTOSTERONE 292.81 (L) 04/10/2023   Lab Results  Component Value Date   HGBA1C 5.3 04/10/2023   Lab Results  Component Value Date   TSH 1.67 04/10/2023   Lab Results  Component Value Date   WBC 8.0 04/10/2023   HGB 16.7 04/10/2023   HCT 49.3 04/10/2023   MCV 90.0 04/10/2023   PLT 234.0 04/10/2023   Lab Results  Component Value Date   NA 139 04/10/2023   CL 103 04/10/2023   K 4.0 04/10/2023   CO2 27 04/10/2023   BUN 20 04/10/2023   CREATININE 0.91 04/10/2023   GFR 111.48 04/10/2023   CALCIUM 9.9 04/10/2023   ALBUMIN 5.0 04/10/2023   GLUCOSE 94 04/10/2023   Lab Results  Component Value Date   ALT 29 04/10/2023   AST 19 04/10/2023   ALKPHOS 62 04/10/2023   BILITOT 0.7 04/10/2023   Lab Results  Component Value Date   CHOL 171 04/10/2023   HDL 53.70 04/10/2023   LDLCALC 96 04/10/2023   TRIG 105.0 04/10/2023   CHOLHDL 3 04/10/2023   Routine general medical examination at a health care facility Assessment & Plan: We discussed the importance of regular  physical activity and healthy diet for prevention of chronic illness and/or complications. Preventive guidelines reviewed. Vaccination: Tdap given today. STD prevention discussed.Next CPE in a year.   Vitamin D deficiency, unspecified Assessment & Plan: He has not been consistent with taking Vit D supplementation. Further recommendations according to 25 OH vit D  result.  Orders: -     VITAMIN D 25 Hydroxy (Vit-D Deficiency, Fractures); Future  Hyperlipidemia, unspecified hyperlipidemia type Assessment & Plan: Mild. He would like to have it checked today. Continue low fat diet.  Orders: -     Lipid panel; Future  Fatigue, unspecified type Assessment & Plan: We discussed possible etiologies: Systemic illness, immunologic,endocrinology,sleep disorder, psychiatric/psychologic, infectious,medications side effects, and idiopathic. Examination today does not suggest a serious process. Further recommendations will be given according to lab results.  Orders: -     Testosterone; Future -     Comprehensive metabolic panel; Future -     CBC; Future -     TSH; Future  Screening for endocrine, metabolic and immunity disorder -     Hemoglobin A1c; Future  Encounter for contraceptive management, unspecified type -     Ambulatory referral to Urology  Patient request for diagnostic testing -     Testosterone; Future -     HSV-2 Ab, IgG; Future  Screen for STD (sexually transmitted disease) -     HSV-2 Ab, IgG; Future -     RPR; Future -     HIV Antibody (routine testing w rflx); Future -     Urine cytology ancillary only  Need for Tdap vaccination -     Tdap vaccine greater than or equal to 7yo IM   Return in 1 year (on 04/09/2024) for CPE.  I, Suanne Marker, acting as a scribe for Maevyn Riordan [place], MD., have documented all relevant documentation on the behalf of Destaney Sarkis [place], MD, as directed by  Tykera Skates [place], MD while in the presence of Shruti Arrey [place], MD.   I, Danylle Ouk [place], MD, have reviewed all documentation for this visit. The documentation on 04/10/23 for the exam, diagnosis, procedures, and orders are all accurate and complete.  Sheretta Grumbine G. [place], MD  [facility] [place] office.

## 2023-04-10 ENCOUNTER — Encounter: Payer: Self-pay | Admitting: Family Medicine

## 2023-04-10 ENCOUNTER — Other Ambulatory Visit (HOSPITAL_COMMUNITY)
Admission: RE | Admit: 2023-04-10 | Discharge: 2023-04-10 | Disposition: A | Discharge status: Home / Self Care | Source: Ambulatory Visit | Attending: Family Medicine | Admitting: Family Medicine

## 2023-04-10 ENCOUNTER — Ambulatory Visit (INDEPENDENT_AMBULATORY_CARE_PROVIDER_SITE_OTHER): Payer: 59 | Admitting: Family Medicine

## 2023-04-10 VITALS — BP 118/70 | HR 85 | Resp 12 | Ht 70.0 in | Wt 252.0 lb

## 2023-04-10 DIAGNOSIS — E785 Hyperlipidemia, unspecified: Secondary | ICD-10-CM

## 2023-04-10 DIAGNOSIS — Z130 Encounter for screening for diseases of the blood and blood-forming organs and certain disorders involving the immune mechanism: Secondary | ICD-10-CM

## 2023-04-10 DIAGNOSIS — R5383 Other fatigue: Secondary | ICD-10-CM | POA: Diagnosis not present

## 2023-04-10 DIAGNOSIS — Z13228 Encounter for screening for other metabolic disorders: Secondary | ICD-10-CM

## 2023-04-10 DIAGNOSIS — Z113 Encounter for screening for infections with a predominantly sexual mode of transmission: Secondary | ICD-10-CM | POA: Diagnosis present

## 2023-04-10 DIAGNOSIS — Z23 Encounter for immunization: Secondary | ICD-10-CM | POA: Diagnosis not present

## 2023-04-10 DIAGNOSIS — Z1329 Encounter for screening for other suspected endocrine disorder: Secondary | ICD-10-CM

## 2023-04-10 DIAGNOSIS — Z309 Encounter for contraceptive management, unspecified: Secondary | ICD-10-CM

## 2023-04-10 DIAGNOSIS — E559 Vitamin D deficiency, unspecified: Secondary | ICD-10-CM

## 2023-04-10 DIAGNOSIS — Z0000 Encounter for general adult medical examination without abnormal findings: Secondary | ICD-10-CM

## 2023-04-10 DIAGNOSIS — R7989 Other specified abnormal findings of blood chemistry: Secondary | ICD-10-CM

## 2023-04-10 DIAGNOSIS — Z0189 Encounter for other specified special examinations: Secondary | ICD-10-CM

## 2023-04-10 LAB — COMPREHENSIVE METABOLIC PANEL
ALT: 29 U/L (ref 0–53)
AST: 19 U/L (ref 0–37)
Albumin: 5 g/dL (ref 3.5–5.2)
Alkaline Phosphatase: 62 U/L (ref 39–117)
BUN: 20 mg/dL (ref 6–23)
CO2: 27 meq/L (ref 19–32)
Calcium: 9.9 mg/dL (ref 8.4–10.5)
Chloride: 103 meq/L (ref 96–112)
Creatinine, Ser: 0.91 mg/dL (ref 0.40–1.50)
GFR: 111.48 mL/min (ref >60.00)
Glucose, Bld: 94 mg/dL (ref 70–99)
Potassium: 4 meq/L (ref 3.5–5.1)
Sodium: 139 meq/L (ref 135–145)
Total Bilirubin: 0.7 mg/dL (ref 0.2–1.2)
Total Protein: 7.6 g/dL (ref 6.0–8.3)

## 2023-04-10 LAB — CBC
HCT: 49.3 % (ref 39.0–52.0)
Hemoglobin: 16.7 g/dL (ref 13.0–17.0)
MCHC: 33.8 g/dL (ref 30.0–36.0)
MCV: 90 fl (ref 78.0–100.0)
Platelets: 234 10*3/uL (ref 150.0–400.0)
RBC: 5.48 Mil/uL (ref 4.22–5.81)
RDW: 12.1 % (ref 11.5–15.5)
WBC: 8 10*3/uL (ref 4.0–10.5)

## 2023-04-10 LAB — HEMOGLOBIN A1C: Hgb A1c MFr Bld: 5.3 % (ref 4.6–6.5)

## 2023-04-10 LAB — VITAMIN D 25 HYDROXY (VIT D DEFICIENCY, FRACTURES): VITD: 33.68 ng/mL (ref 30.00–100.00)

## 2023-04-10 LAB — LIPID PANEL
Cholesterol: 171 mg/dL (ref 0–200)
HDL: 53.7 mg/dL (ref >39.00)
LDL Cholesterol: 96 mg/dL (ref 0–99)
NonHDL: 117
Total CHOL/HDL Ratio: 3
Triglycerides: 105 mg/dL (ref 0.0–149.0)
VLDL: 21 mg/dL (ref 0.0–40.0)

## 2023-04-10 LAB — TSH: TSH: 1.67 u[IU]/mL (ref 0.35–5.50)

## 2023-04-10 LAB — TESTOSTERONE: Testosterone: 292.81 ng/dL — ABNORMAL LOW (ref 300.00–890.00)

## 2023-04-10 NOTE — Patient Instructions (Addendum)
 A few things to remember from today's visit:  Routine general medical examination at a health care facility  Vitamin D deficiency, unspecified - Plan: VITAMIN D 25 Hydroxy (Vit-D Deficiency, Fractures)  Hyperlipidemia, unspecified hyperlipidemia type - Plan: Lipid panel  Screening for endocrine, metabolic and immunity disorder - Plan: Hemoglobin A1c  Encounter for contraceptive management, unspecified type - Plan: Ambulatory referral to Urology  Patient request for diagnostic testing - Plan: Testosterone, HSV-2 Ab, IgG  Fatigue, unspecified type - Plan: Testosterone, Comprehensive metabolic panel, CBC, TSH  Screen for STD (sexually transmitted disease) - Plan: HSV-2 Ab, IgG, RPR, HIV Antibody (routine testing w rflx), Urine cytology ancillary only  If you need refills for medications you take chronically, please call your pharmacy. Do not use My Chart to request refills or for acute issues that need immediate attention. If you send a my chart message, it may take a few days to be addressed, specially if I am not in the office.  Please be sure medication list is accurate. If a new problem present, please set up appointment sooner than planned today.  Health Maintenance, Male Adopting a healthy lifestyle and getting preventive care are important in promoting health and wellness. Ask your health care provider about: The right schedule for you to have regular tests and exams. Things you can do on your own to prevent diseases and keep yourself healthy. What should I know about diet, weight, and exercise? Eat a healthy diet  Eat a diet that includes plenty of vegetables, fruits, low-fat dairy products, and lean protein. Do not eat a lot of foods that are high in solid fats, added sugars, or sodium. Maintain a healthy weight Body mass index (BMI) is a measurement that can be used to identify possible weight problems. It estimates body fat based on height and weight. Your health care  provider can help determine your BMI and help you achieve or maintain a healthy weight. Get regular exercise Get regular exercise. This is one of the most important things you can do for your health. Most adults should: Exercise for at least 150 minutes each week. The exercise should increase your heart rate and make you sweat (moderate-intensity exercise). Do strengthening exercises at least twice a week. This is in addition to the moderate-intensity exercise. Spend less time sitting. Even light physical activity can be beneficial. Watch cholesterol and blood lipids Have your blood tested for lipids and cholesterol at 33 years of age, then have this test every 5 years. You may need to have your cholesterol levels checked more often if: Your lipid or cholesterol levels are high. You are older than 33 years of age. You are at high risk for heart disease. What should I know about cancer screening? Many types of cancers can be detected early and may often be prevented. Depending on your health history and family history, you may need to have cancer screening at various ages. This may include screening for: Colorectal cancer. Prostate cancer. Skin cancer. Lung cancer. What should I know about heart disease, diabetes, and high blood pressure? Blood pressure and heart disease High blood pressure causes heart disease and increases the risk of stroke. This is more likely to develop in people who have high blood pressure readings or are overweight. Talk with your health care provider about your target blood pressure readings. Have your blood pressure checked: Every 3-5 years if you are 64-72 years of age. Every year if you are 34 years old or older. If you are  between the ages of 80 and 21 and are a current or former smoker, ask your health care provider if you should have a one-time screening for abdominal aortic aneurysm (AAA). Diabetes Have regular diabetes screenings. This checks your fasting  blood sugar level. Have the screening done: Once every three years after age 12 if you are at a normal weight and have a low risk for diabetes. More often and at a younger age if you are overweight or have a high risk for diabetes. What should I know about preventing infection? Hepatitis B If you have a higher risk for hepatitis B, you should be screened for this virus. Talk with your health care provider to find out if you are at risk for hepatitis B infection. Hepatitis C Blood testing is recommended for: Everyone born from [birth year] through 1965. Anyone with known risk factors for hepatitis C. Sexually transmitted infections (STIs) You should be screened each year for STIs, including gonorrhea and chlamydia, if: You are sexually active and are younger than 33 years of age. You are older than 33 years of age and your health care provider tells you that you are at risk for this type of infection. Your sexual activity has changed since you were last screened, and you are at increased risk for chlamydia or gonorrhea. Ask your health care provider if you are at risk. Ask your health care provider about whether you are at high risk for HIV. Your health care provider may recommend a prescription medicine to help prevent HIV infection. If you choose to take medicine to prevent HIV, you should first get tested for HIV. You should then be tested every 3 months for as long as you are taking the medicine. Follow these instructions at home: Alcohol use Do not drink alcohol if your health care provider tells you not to drink. If you drink alcohol: Limit how much you have to 0-2 drinks a day. Know how much alcohol is in your drink. In the [place] one drink equals one 12 oz bottle of beer (355 mL), one 5 oz glass of wine (148 mL), or one 1 oz glass of hard liquor (44 mL). Lifestyle Do not use any products that contain nicotine or tobacco. These products include cigarettes, chewing tobacco, and vaping devices,  such as e-cigarettes. If you need help quitting, ask your health care provider. Do not use street drugs. Do not share needles. Ask your health care provider for help if you need support or information about quitting drugs. General instructions Schedule regular health, dental, and eye exams. Stay current with your vaccines. Tell your health care provider if: You often feel depressed. You have ever been abused or do not feel safe at home. Summary Adopting a healthy lifestyle and getting preventive care are important in promoting health and wellness. Follow your health care provider's instructions about healthy diet, exercising, and getting tested or screened for diseases. Follow your health care provider's instructions on monitoring your cholesterol and blood pressure. This information is not intended to replace advice given to you by your health care provider. Make sure you discuss any questions you have with your health care provider. Document Revised: 06/15/2020 Document Reviewed: 06/15/2020 Elsevier Patient Education  2024 [employer].

## 2023-04-10 NOTE — Assessment & Plan Note (Signed)
 We discussed the importance of regular physical activity and healthy diet for prevention of chronic illness and/or complications. Preventive guidelines reviewed. Vaccination: Tdap given today. STD prevention discussed.Next CPE in a year.

## 2023-04-10 NOTE — Assessment & Plan Note (Signed)
 Mild. He would like to have it checked today. Continue low fat diet.

## 2023-04-10 NOTE — Assessment & Plan Note (Addendum)
 He has not been consistent with taking Vit D supplementation. Further recommendations according to 25 OH vit D result.

## 2023-04-10 NOTE — Assessment & Plan Note (Signed)
We discussed possible etiologies: Systemic illness, immunologic,endocrinology,sleep disorder, psychiatric/psychologic, infectious,medications side effects, and idiopathic. Examination today does not suggest a serious process.  Further recommendations will be given according to lab results.  

## 2023-04-11 LAB — URINE CYTOLOGY ANCILLARY ONLY
Chlamydia: NEGATIVE
Comment: NEGATIVE
Comment: NEGATIVE
Comment: NORMAL
Neisseria Gonorrhea: NEGATIVE
Trichomonas: NEGATIVE

## 2023-04-11 LAB — HIV ANTIBODY (ROUTINE TESTING W REFLEX): HIV 1&2 Ab, 4th Generation: NONREACTIVE

## 2023-04-11 LAB — HSV-2 AB, IGG: HSV 2 IgG, Type Spec: NONREACTIVE

## 2023-04-11 LAB — RPR: RPR Ser Ql: NONREACTIVE

## 2023-05-18 ENCOUNTER — Other Ambulatory Visit (HOSPITAL_BASED_OUTPATIENT_CLINIC_OR_DEPARTMENT_OTHER): Payer: Self-pay

## 2023-05-18 MED ORDER — DIAZEPAM 10 MG PO TABS
10.0000 mg | ORAL_TABLET | Freq: Once | ORAL | 0 refills | Status: AC
  Filled 2023-05-18 – 2023-05-30 (×2): qty 1, 1d supply, fill #0

## 2023-05-29 ENCOUNTER — Other Ambulatory Visit (HOSPITAL_BASED_OUTPATIENT_CLINIC_OR_DEPARTMENT_OTHER): Payer: Self-pay

## 2023-05-30 ENCOUNTER — Other Ambulatory Visit (HOSPITAL_BASED_OUTPATIENT_CLINIC_OR_DEPARTMENT_OTHER): Payer: Self-pay

## 2023-09-29 ENCOUNTER — Telehealth: Payer: Self-pay | Admitting: Family Medicine

## 2023-09-29 NOTE — Telephone Encounter (Signed)
In folder to be signed

## 2023-09-29 NOTE — Telephone Encounter (Signed)
 Patient dropped off forms to be filled out by Dr [place] for job. Forms in folder

## 2023-10-02 NOTE — Telephone Encounter (Signed)
 Faxed confirmation received.

## 2023-10-13 ENCOUNTER — Other Ambulatory Visit: Payer: Self-pay

## 2023-10-13 ENCOUNTER — Other Ambulatory Visit (INDEPENDENT_AMBULATORY_CARE_PROVIDER_SITE_OTHER)

## 2023-10-13 DIAGNOSIS — R7989 Other specified abnormal findings of blood chemistry: Secondary | ICD-10-CM

## 2023-10-14 ENCOUNTER — Ambulatory Visit: Payer: Self-pay | Admitting: Family Medicine

## 2023-10-20 LAB — TESTOSTERONE, FREE, TOTAL, SHBG
Sex Hormone Binding: 27.8 nmol/L (ref 16.5–55.9)
Testosterone, Free: 14.1 pg/mL (ref 8.7–25.1)
Testosterone: 377 ng/dL (ref 264–916)

## 2023-11-17 ENCOUNTER — Ambulatory Visit: Payer: Self-pay

## 2023-11-17 NOTE — Telephone Encounter (Signed)
 FYI Only or Action Required?: FYI only for provider.  Patient was last seen in primary care on 04/10/2023 by [place], Betty G, MD.  Called Nurse Triage reporting Foot Injury.  Symptoms began several weeks ago. - But got much worse with workout yesterday  Interventions attempted: OTC medications: IBU.  Symptoms are: rapidly worsening. Became much worse yesterday  Triage Disposition: See Physician Within 24 Hours  Patient/caregiver understands and will follow disposition?: Yes - pt will go to [facility] today.                         Copied from CRM [id number]. Topic: Clinical - Red Word Triage >> Nov 17, 2023  8:21 AM Carlyon D wrote: Red Word that prompted transfer to Nurse Triage: Pt is having excruciating pain on left lower shin area top of the ankle and top of the foot. Pain is unbearable. Reason for Disposition  [1] Limp when walking AND [2] due to twisting injury  Answer Assessment - Initial Assessment Questions 1. MECHANISM: How did the injury happen? (e.g., twisting injury, direct blow)      2 weeks ago did strenuous leg day and that is when dull pain started 2. ONSET: When did the injury happen? (e.g., minutes or hours ago)      Nilsa had a work out and pain greatly increased 3. LOCATION: Where is the injury located?      Tendons above the ankle 4. APPEARANCE of INJURY: What does the injury look like?      Does not look different 5. WEIGHT-BEARING: Can you put weight on that foot? Can you walk (four steps or more)?       yes  7. PAIN: Is there pain? If Yes, ask: How bad is the pain?  What does it keep you from doing? (Scale 0-10; or none, mild, moderate, severe)     yes 8. TETANUS: For any breaks in the skin, ask: When was your last tetanus booster?     NA 9. OTHER SYMPTOMS: Do you have any other symptoms?      No swollen  Protocols used: Ankle Injury-A-AH

## 2023-11-21 ENCOUNTER — Other Ambulatory Visit (HOSPITAL_BASED_OUTPATIENT_CLINIC_OR_DEPARTMENT_OTHER): Payer: Self-pay

## 2023-11-21 MED ORDER — DICLOFENAC SODIUM 75 MG PO TBEC
75.0000 mg | DELAYED_RELEASE_TABLET | Freq: Two times a day (BID) | ORAL | 0 refills | Status: AC
  Filled 2023-11-21: qty 60, 30d supply, fill #0

## 2024-03-21 IMAGING — DX DG CHEST 2V
2 series · 2 of 2 positions shown · non-contrast
Comparison: None Available.

CLINICAL DATA: 30-year-old male with cough and wheezing

EXAM:
CHEST - 2 VIEW

[chest pa]
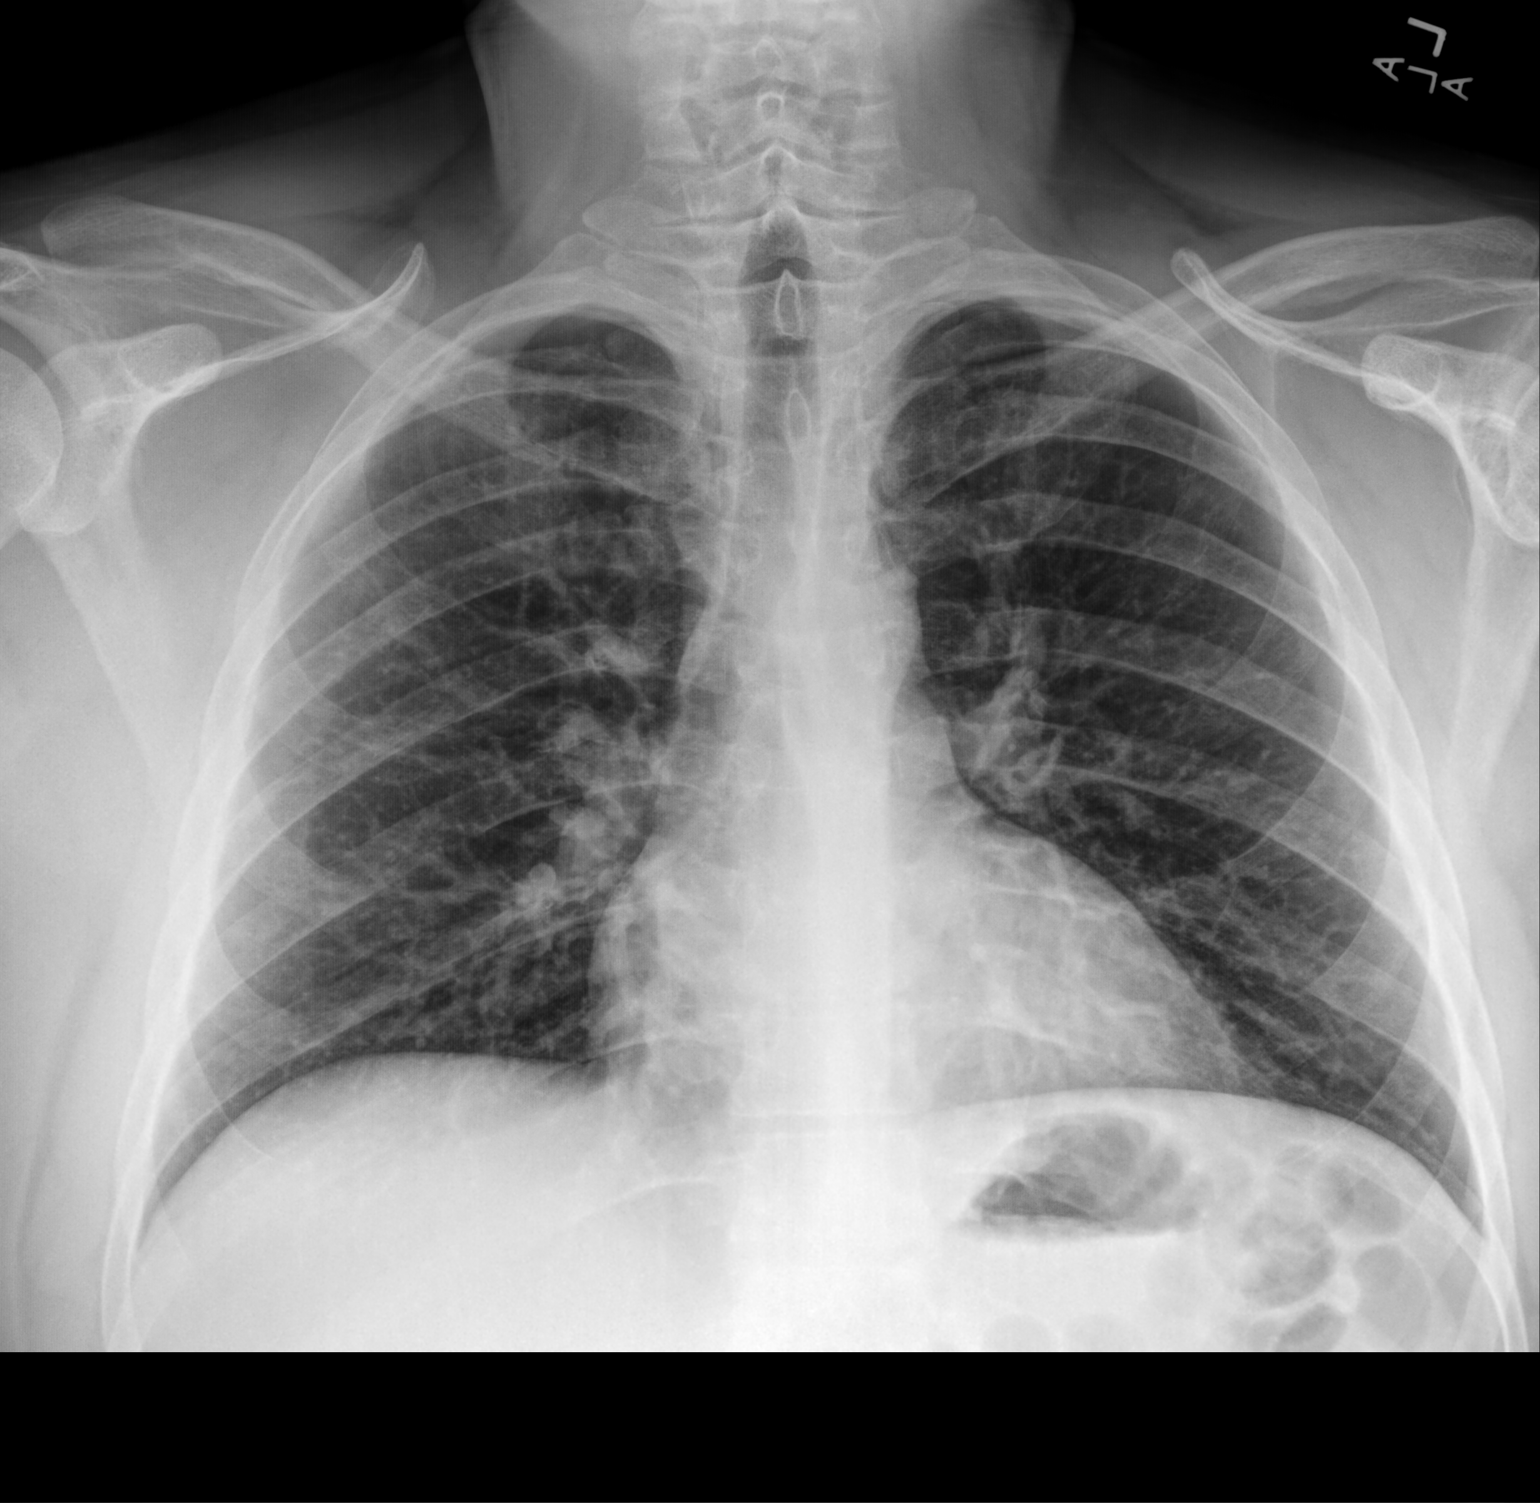

[chest lat]
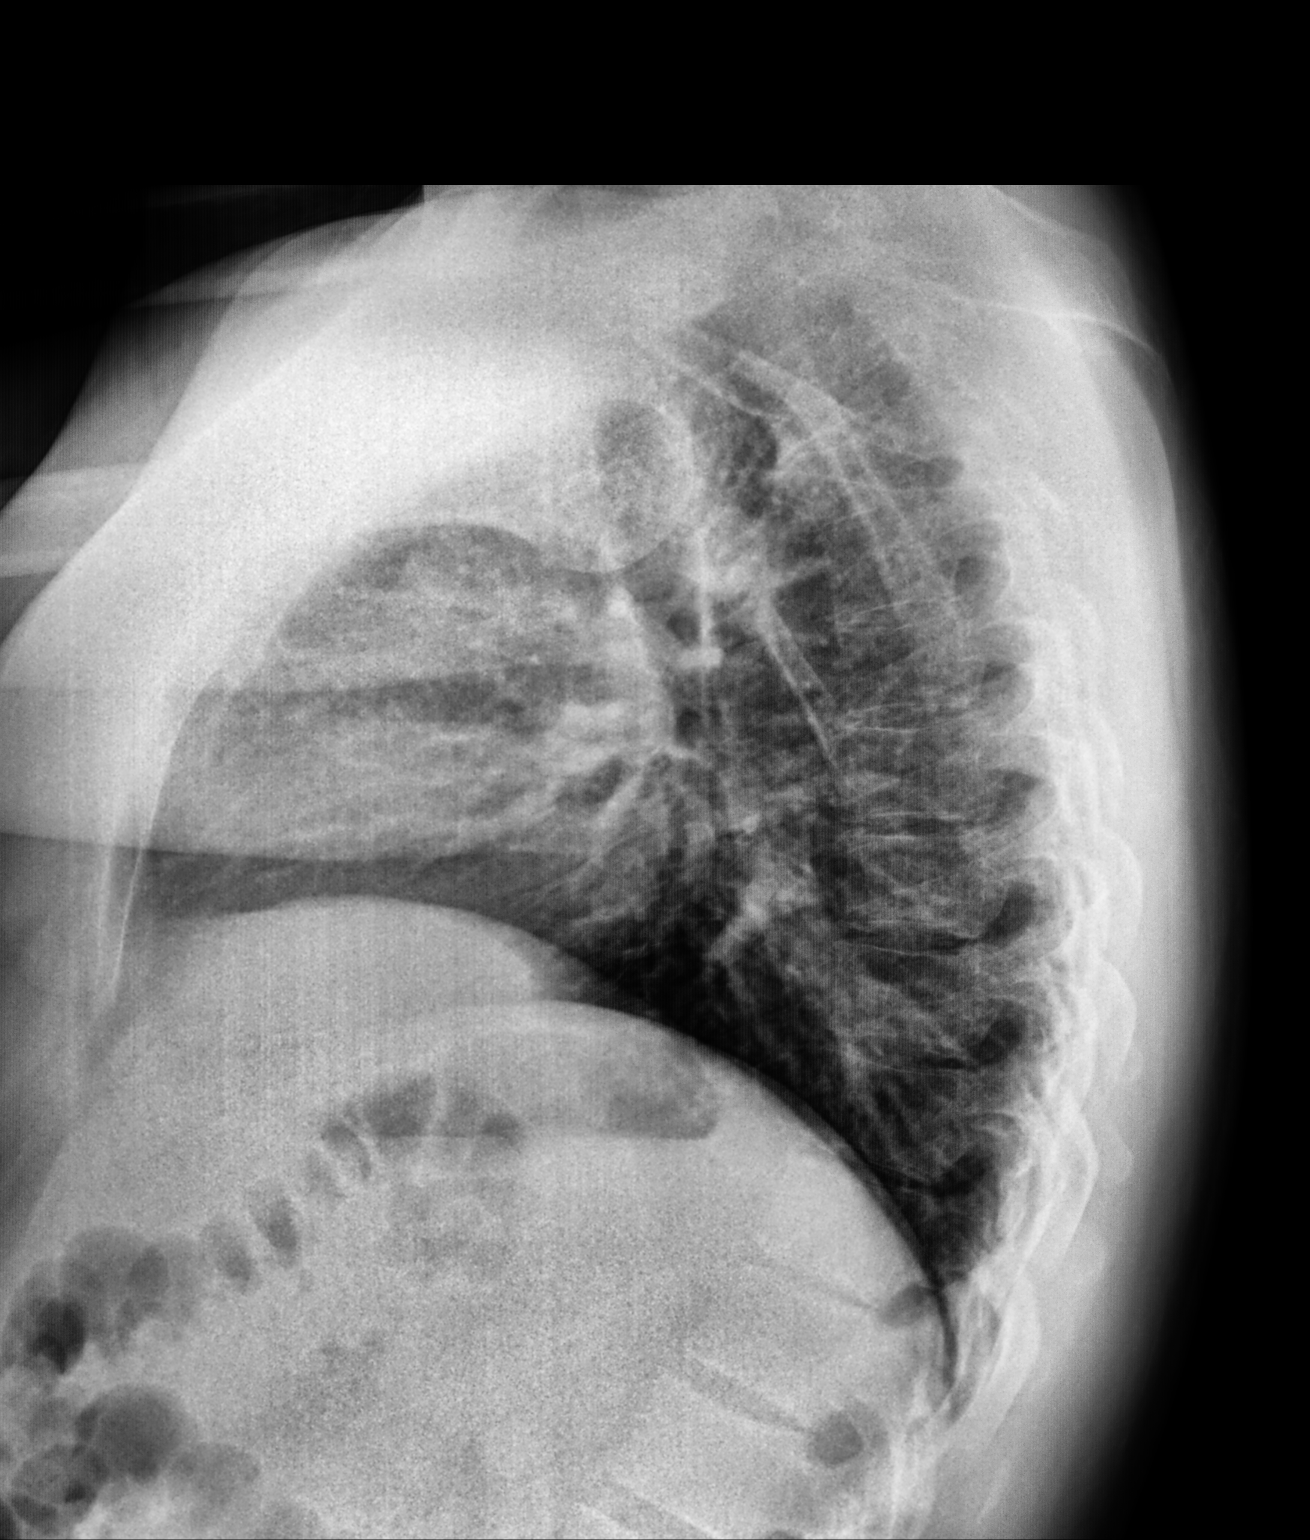

[2 of 2 positions shown; findings below may reference images not displayed]

FINDINGS: Cardiomediastinal silhouette within normal limits in size and
contour. No evidence of central vascular congestion. No interlobular
septal thickening.

Low lung volumes.

No pneumothorax or pleural effusion.  No confluent airspace disease.

No acute displaced fracture
IMPRESSION: Low lung volumes with no definite evidence of acute cardiopulmonary
disease

## 2024-04-17 ENCOUNTER — Encounter: Admitting: Family Medicine
